# Patient Record
Sex: Male | Born: 1985 | Race: White | Hispanic: No | Marital: Single | Smoking: Never smoker
Health system: Southern US, Community
[De-identification: ages and names within clinical notes are randomized; demographics above are authoritative.]

## PROBLEM LIST (undated history)

## (undated) DIAGNOSIS — Z789 Other specified health status: Secondary | ICD-10-CM

## (undated) HISTORY — PX: TONSILLECTOMY: SUR1361

## (undated) HISTORY — PX: CYSTECTOMY: SUR359

---
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
## 2011-07-12 ENCOUNTER — Emergency Department (HOSPITAL_COMMUNITY)
Admission: EM | Admit: 2011-07-12 | Discharge: 2011-07-12 | Disposition: A | Discharge status: Home / Self Care | Payer: Self-pay | Attending: Emergency Medicine | Admitting: Emergency Medicine

## 2011-07-12 ENCOUNTER — Emergency Department (HOSPITAL_COMMUNITY): Payer: Self-pay

## 2011-07-12 DIAGNOSIS — N508 Other specified disorders of male genital organs: Secondary | ICD-10-CM | POA: Insufficient documentation

## 2011-07-12 DIAGNOSIS — Z049 Encounter for examination and observation for unspecified reason: Secondary | ICD-10-CM | POA: Insufficient documentation

## 2012-08-23 ENCOUNTER — Emergency Department (HOSPITAL_COMMUNITY)
Admission: EM | Admit: 2012-08-23 | Discharge: 2012-08-23 | Disposition: A | Discharge status: Home / Self Care | Payer: Self-pay | Attending: Emergency Medicine | Admitting: Emergency Medicine

## 2012-08-23 ENCOUNTER — Encounter (HOSPITAL_COMMUNITY): Payer: Self-pay | Admitting: *Deleted

## 2012-08-23 DIAGNOSIS — Z880 Allergy status to penicillin: Secondary | ICD-10-CM | POA: Insufficient documentation

## 2012-08-23 DIAGNOSIS — IMO0002 Reserved for concepts with insufficient information to code with codable children: Secondary | ICD-10-CM | POA: Insufficient documentation

## 2012-08-23 DIAGNOSIS — L02412 Cutaneous abscess of left axilla: Secondary | ICD-10-CM

## 2012-08-23 MED ORDER — OXYCODONE-ACETAMINOPHEN 5-325 MG PO TABS: 2.0000 | ORAL_TABLET | ORAL | Status: AC | PRN

## 2012-08-23 MED ORDER — CLINDAMYCIN HCL 150 MG PO CAPS: 300.0000 mg | ORAL_CAPSULE | Freq: Three times a day (TID) | ORAL | Status: AC

## 2012-08-23 MED ORDER — OXYCODONE-ACETAMINOPHEN 5-325 MG PO TABS
1.0000 | ORAL_TABLET | Freq: Once | ORAL | Status: AC
  Administered 2012-08-23: 1 via ORAL
  Filled 2012-08-23: qty 1

## 2012-08-23 NOTE — ED Provider Notes (Signed)
History     CSN: [id number]  Arrival date & time 08/23/12  0049   First MD Initiated Contact with Patient 08/23/12 [phone]      Chief Complaint  Patient presents with  . Recurrent Skin Infections    (Consider location/radiation/quality/duration/timing/severity/associated sxs/prior treatment) HPI History provided by pt.   Pt developed an abscess of right axilla 2 days ago.  Has pain with ROM of RUE.  No associated fever and is otherwise feeling well.  Has used warm compresses w/out relief.    History reviewed. No pertinent past medical history.  History reviewed. No pertinent past surgical history.  No family history on file.  History  Substance Use Topics  . Smoking status: Not on file  . Smokeless tobacco: Not on file  . Alcohol Use: Not on file      Review of Systems  All other systems reviewed and are negative.    Allergies  Penicillins  Home Medications  No current outpatient prescriptions on file.  BP 123/76  Pulse 78  Temp 98.5 F (36.9 C) (Oral)  Resp 18  SpO2 100%  Physical Exam  Nursing note and vitals reviewed. Constitutional: He is oriented to person, place, and time. He appears well-developed and well-nourished. No distress.  HENT:  Head: Normocephalic and atraumatic.  Eyes:       Normal appearance  Neck: Normal range of motion.  Pulmonary/Chest: Effort normal.  Musculoskeletal: Normal range of motion.  Neurological: He is alert and oriented to person, place, and time.  Skin:       4x3cm abscess left axilla w/ erythema spreading into upper arm.  Non-draining.  Ttp.    Psychiatric: He has a normal mood and affect. His behavior is normal.    ED Course  Procedures (including critical care time) INCISION AND DRAINAGE Performed by: Otilio Miu Consent: Verbal consent obtained. Risks and benefits: risks, benefits and alternatives were discussed Type: abscess  Body area: L axilla     Anesthesia: local infiltration  Local  anesthetic: lidocaine 2% w/ epinephrine  Anesthetic total: 10 ml  Complexity: complex Blunt dissection to break up loculations  Drainage: purulent  Drainage amount: large  Packing material: 1/4 in iodoform gauze  Patient tolerance: Patient tolerated the procedure well with no immediate complications.    Labs Reviewed - No data to display No results found.   1. Abscess of axilla, left       MDM  Healthy 26yo M presents w/ abscess of L axilla w/ surrounding cellulitis.  I&D'd, margins of cellulitis drawn and d/c'd home w/ clinda (pen allergic) and percocet.  Will return in 2d for recheck.        Otilio Miu, [place] 08/23/12 [phone]

## 2012-08-23 NOTE — ED Notes (Signed)
Pt c/o boil under left arm x 1 wk; draining

## 2012-08-23 NOTE — ED Provider Notes (Signed)
Medical screening examination/treatment/procedure(s) were performed by non-physician practitioner and as supervising physician I was immediately available for consultation/collaboration.  De Libman K Odetta Forness-Rasch, MD 08/23/12 0745 

## 2012-08-29 IMAGING — US US SCROTUM
1 series · 14 of 25 positions shown · non-contrast
Comparison: None

CLINICAL DATA: Left scrotal mass.

SCROTAL ULTRASOUND
DOPPLER ULTRASOUND OF THE TESTICLES
TECHNIQUE: Complete ultrasound examination of the testicles,
epididymis, and other scrotal structures was performed.  Color and
spectral Doppler ultrasound were also utilized to evaluate blood
flow to the testicles.

[Series 1: us scrotum · 0.08mm/px · 14 of 39 slices shown]
[im 1/39]
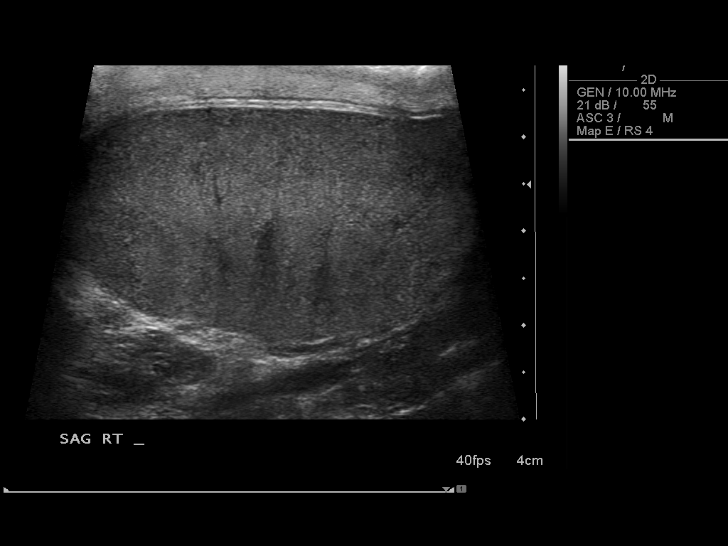
[im 4/39]
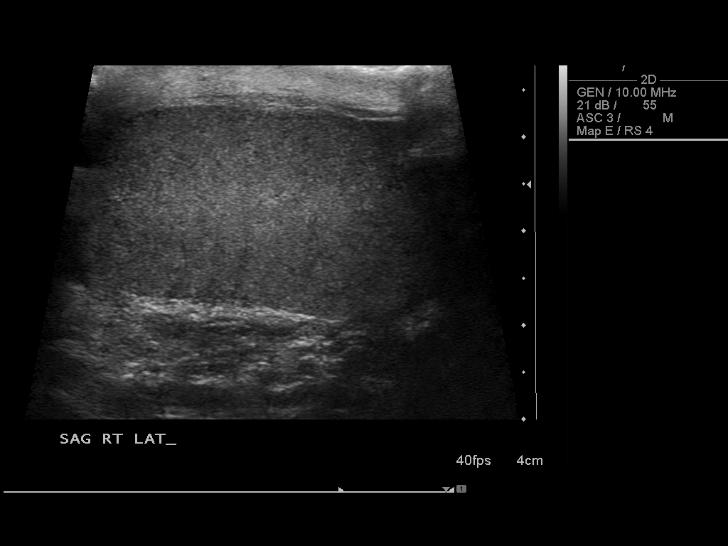
[im 7/39]
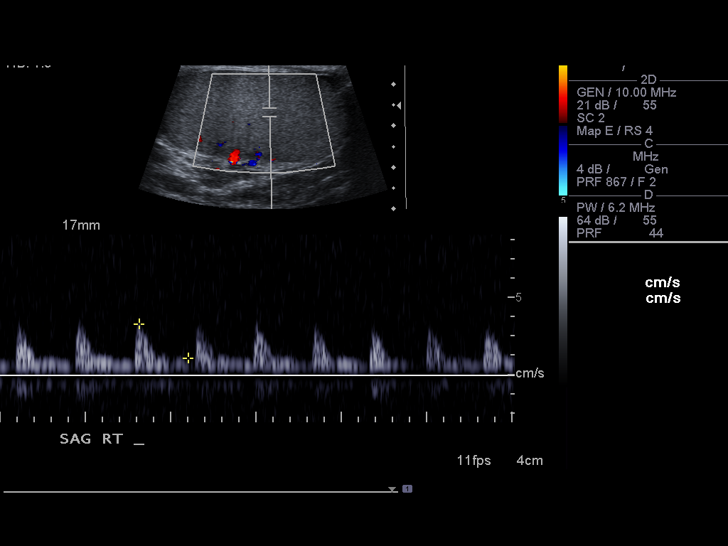
[im 10/39]
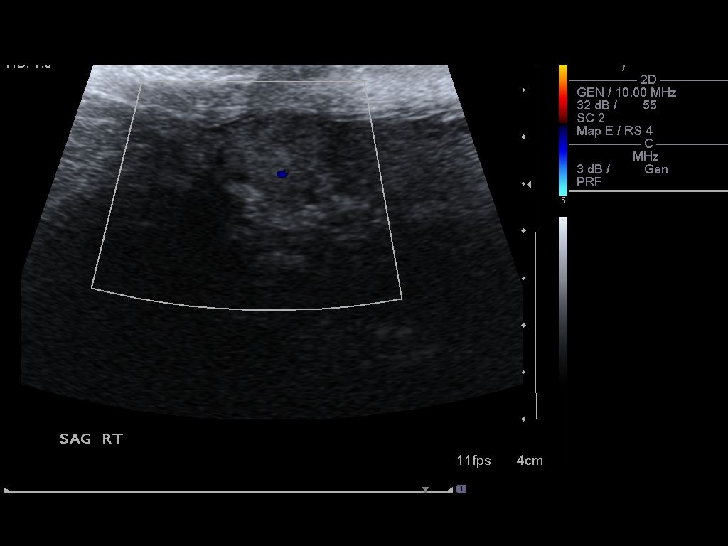
[im 13/39]
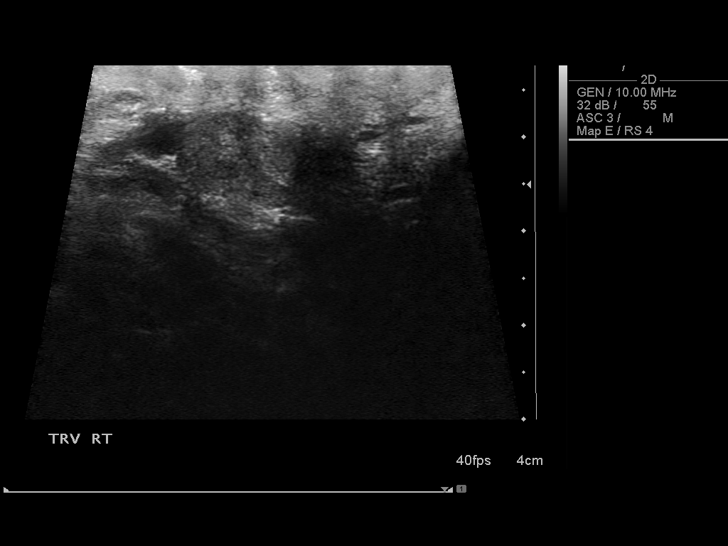
[im 15/39]
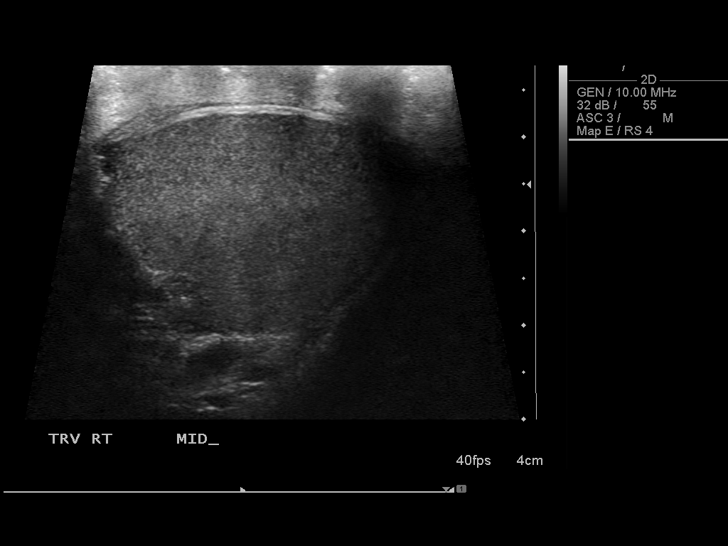
[im 18/39]
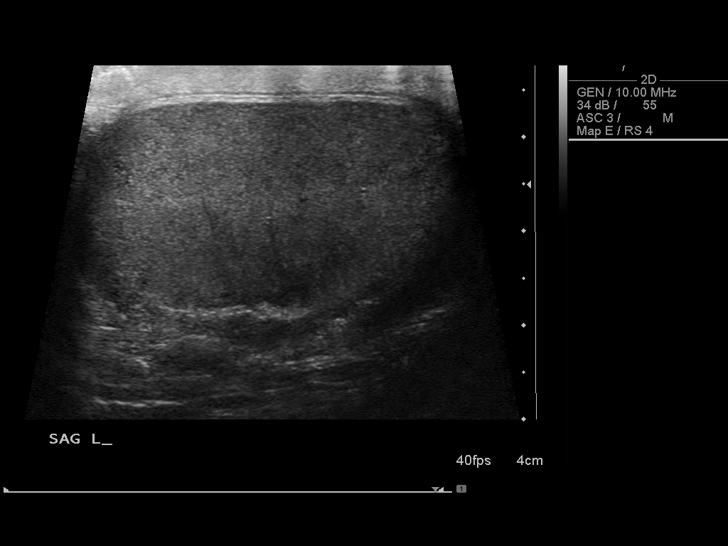
[im 21/39]
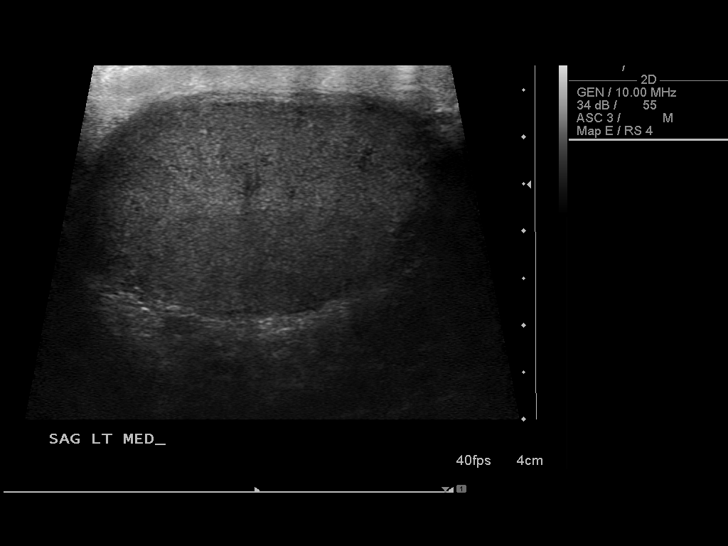
[im 24/39]
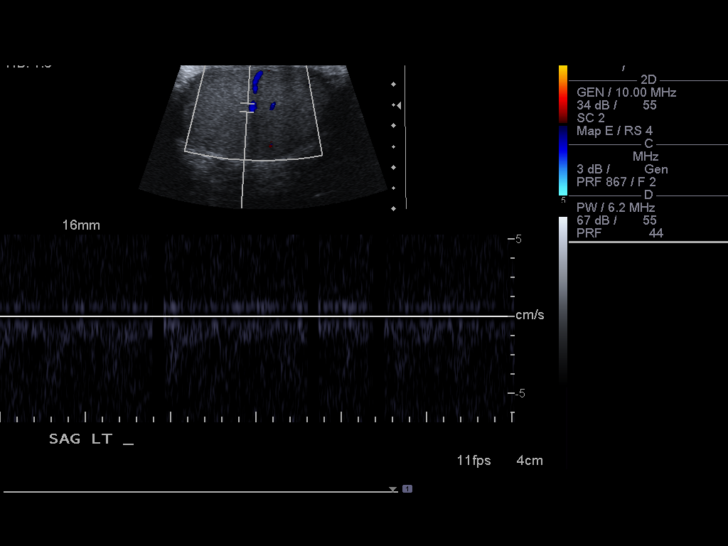
[im 26/39]
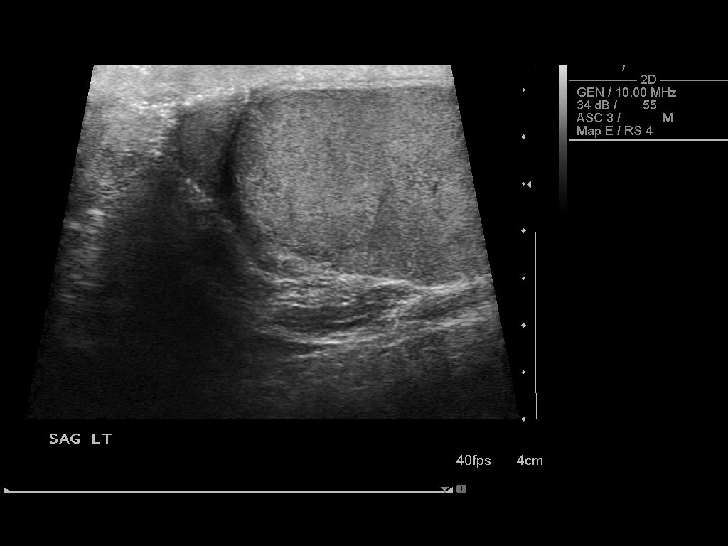
[im 29/39]
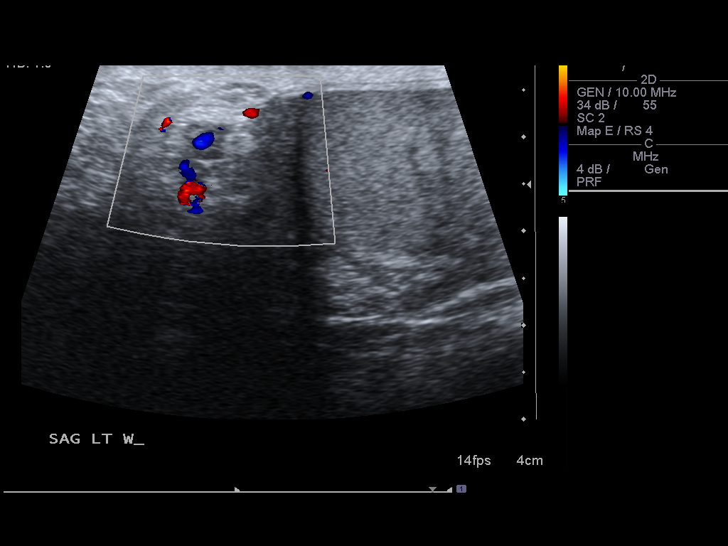
[im 32/39]
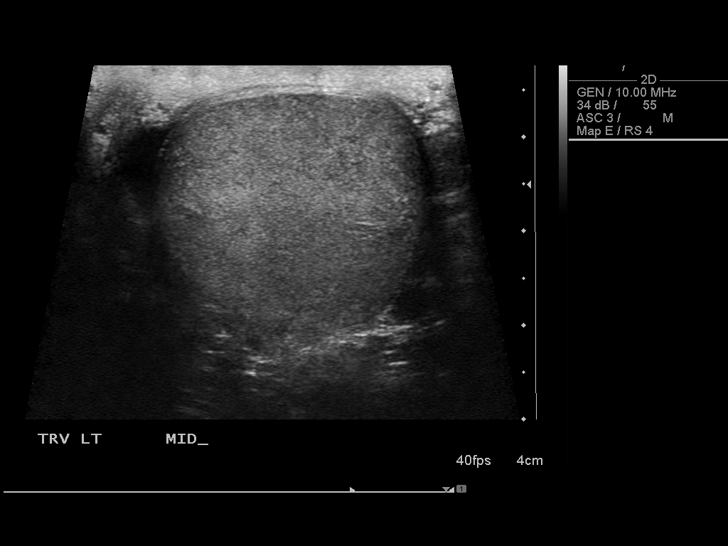
[im 35/39]
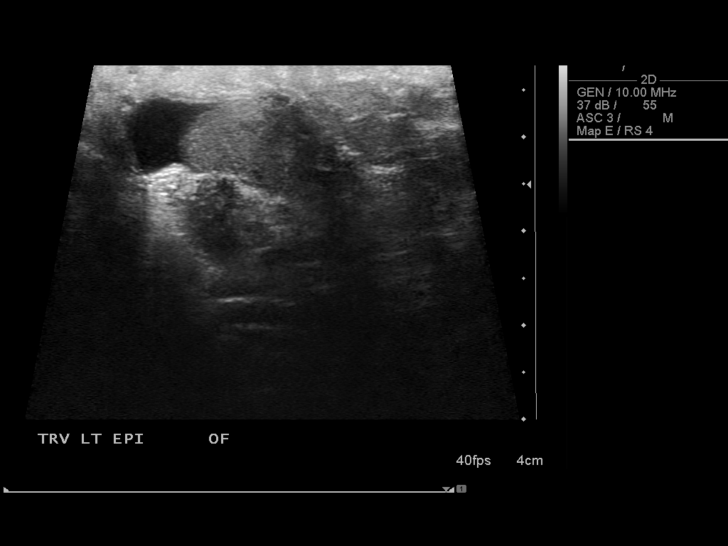
[im 39/39]
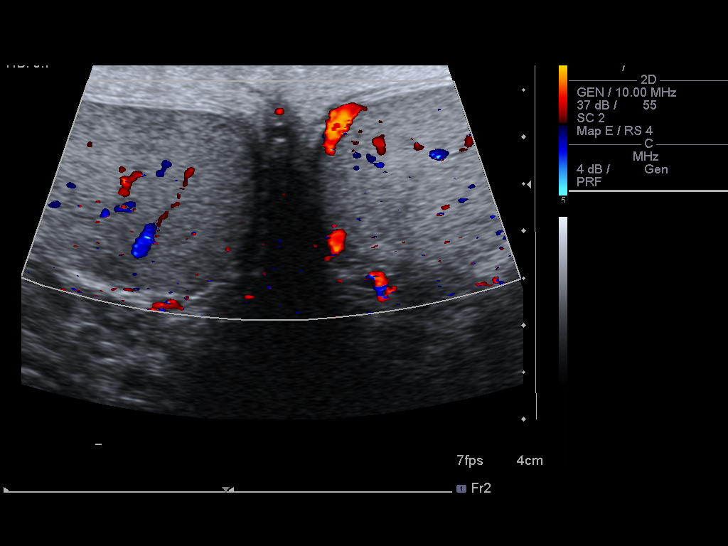

[14 of 25 positions shown; findings below may reference images not displayed]

FINDINGS: Right testis:  Measures 4.3 x 2.6 x 2.5 cm.  Normal homogeneous
echogenicity without focal lesions.  Patent intratesticular blood
flow.

Left testis:  Measures 3.9 x 2.3 x 2.8 cm.  Normal homogeneous
echogenicity without focal lesions.  Patent intra testicular blood
flow.

Right epididymis:  Normal in size and appearance. No increased
blood flow to suggest epididymitis.

Left epididymis:  Normal in size and appearance. No increased blood
flow to suggest epididymitis.

Hydocele:  None

Varicocele:  None

Pulsed Doppler interrogation of both testes demonstrates low
resistance flow bilaterally.
IMPRESSION: Normal scrotal ultrasound.  No mass is identified.  Recommend
clinical follow-up.

## 2012-10-09 ENCOUNTER — Emergency Department (HOSPITAL_COMMUNITY)
Admission: EM | Admit: 2012-10-09 | Discharge: 2012-10-09 | Disposition: A | Discharge status: Home / Self Care | Payer: Self-pay | Attending: Emergency Medicine | Admitting: Emergency Medicine

## 2012-10-09 ENCOUNTER — Encounter (HOSPITAL_COMMUNITY): Payer: Self-pay | Admitting: Emergency Medicine

## 2012-10-09 DIAGNOSIS — L03019 Cellulitis of unspecified finger: Secondary | ICD-10-CM | POA: Insufficient documentation

## 2012-10-09 DIAGNOSIS — IMO0001 Reserved for inherently not codable concepts without codable children: Secondary | ICD-10-CM

## 2012-10-09 MED ORDER — MORPHINE SULFATE 4 MG/ML IJ SOLN
2.0000 mg | Freq: Once | INTRAMUSCULAR | Status: AC
  Administered 2012-10-09: 2 mg via INTRAVENOUS
  Filled 2012-10-09: qty 1

## 2012-10-09 MED ORDER — BACITRACIN ZINC 500 UNIT/GM EX OINT
TOPICAL_OINTMENT | CUTANEOUS | Status: AC
  Administered 2012-10-09: 20:00:00
  Filled 2012-10-09: qty 0.9

## 2012-10-09 MED ORDER — CLINDAMYCIN PHOSPHATE 900 MG/50ML IV SOLN
900.0000 mg | Freq: Once | INTRAVENOUS | Status: AC
  Administered 2012-10-09: 900 mg via INTRAVENOUS
  Filled 2012-10-09: qty 50

## 2012-10-09 MED ORDER — HYDROCODONE-ACETAMINOPHEN 5-325 MG PO TABS: 2.0000 | ORAL_TABLET | ORAL | Status: DC | PRN

## 2012-10-09 MED ORDER — SULFAMETHOXAZOLE-TRIMETHOPRIM 800-160 MG PO TABS: 1.0000 | ORAL_TABLET | Freq: Two times a day (BID) | ORAL | Status: DC

## 2012-10-09 NOTE — ED Provider Notes (Signed)
History  This chart was scribed for non-physician practitioner working with Lawrence Canal, MD by Shari Heritage. This patient was seen in room WTR5/WTR5 and the patient's care was started at 1733.   CSN: [id number]  Arrival date & time 10/09/12  1328     Chief Complaint  Patient presents with  . Hand Pain    Patient is a 26 y.o. male presenting with hand pain. The history is provided by the patient. No language interpreter was used.  Hand Pain This is a new problem. The current episode started 2 days ago. The problem occurs constantly. The problem has not changed since onset.Nothing aggravates the symptoms. Nothing relieves the symptoms. He has tried nothing for the symptoms.    HPI Comments: Lawrence Hampton is a 26 y.o. male who presents to the Emergency Department complaining of constant, throbbing, non-radiating left 3rd finger pain with associated swelling onset 2 days ago. Patient denies fever and numbness or tingling in the finger. He states that he has soaked his hand in a salt solution with no relief. Patient is a nail biter. He denies any obvious injury or trauma to the hand. Patient is allergic to penicillin. He reports no significant past medical history. He has never smoked.   History reviewed. No pertinent past medical history.  Past Surgical History  Procedure Date  . Tonsillectomy     History reviewed. No pertinent family history.  History  Substance Use Topics  . Smoking status: Never Smoker   . Smokeless tobacco: Not on file  . Alcohol Use: No      Review of Systems  Constitutional: Negative for fever.  Musculoskeletal: Positive for myalgias.  Neurological: Negative for weakness and numbness.  All other systems reviewed and are negative.    Allergies  Penicillins  Home Medications   Current Outpatient Rx  Name Route Sig Dispense Refill  . GOODY HEADACHE PO Oral Take 1 Package by mouth as needed. Headache    . DIPHENHYDRAMINE-APAP (SLEEP) 25-500 MG  PO TABS Oral Take 1 tablet by mouth at bedtime as needed. Pain      BP 143/84  Pulse 75  Temp 98.5 F (36.9 C) (Oral)  Resp 16  SpO2 97%  Physical Exam  Nursing note and vitals reviewed. Constitutional: He is oriented to person, place, and time. He appears well-developed and well-nourished. No distress.  HENT:  Head: Normocephalic and atraumatic.  Eyes: Conjunctivae normal are normal.  Neck: Normal range of motion. Neck supple.  Cardiovascular: Normal rate, regular rhythm and intact distal pulses.  Exam reveals no gallop and no friction rub.   No murmur heard. Pulmonary/Chest: Effort normal and breath sounds normal. No respiratory distress. He has no wheezes. He has no rales. He exhibits no tenderness.  Abdominal: Soft. He exhibits no distension.  Musculoskeletal: Normal range of motion.  Neurological: He is alert and oriented to person, place, and time. Coordination normal.       Speech is goal-oriented. Moves limbs without ataxia.   Skin: Skin is warm and dry. He is not diaphoretic.       Left hand 3rd finger nailbed erythematous, edematous, with underlying pus. Left 3rd finger tender to palpation. Red streaking from left 3rd finger down hand, toward wrist.   Psychiatric: He has a normal mood and affect. His behavior is normal.    ED Course  Procedures (including critical care time) DIAGNOSTIC STUDIES: Oxygen Saturation is 97% on room air, adequate by my interpretation.    COORDINATION OF  CARE: 5:33pm- Patient informed of current plan for treatment and evaluation and agrees with plan at this time.   7:42pm- Performed I&D to left 3rd finger.  INCISION AND DRAINAGE PROCEDURE NOTE: Patient identification was confirmed and verbal consent was obtained. This procedure was performed by Emilia Beck, PA at 7:42 PM. Site: left 3rd finger nailbed Sterile procedures observed: yes Blade size: 11 Drainage: 3 ml Complexity: simple Incision made over site, wound drained, rinsed  with copious amounts of normal saline, covered with dry, sterile dressing. Pt tolerated procedure well without complications.  Instructions for care discussed verbally and pt provided with additional written instructions for homecare and f/u.      Labs Reviewed - No data to display No results found.   1. Paronychia of third finger, left       MDM  Patient received IV Clindamycin 900mg . Paronychia drained without complication. Patient given Bactrim and Percocet for prescriptions. Patient should return with worsening or concerning symptoms.   This chart was written by a scribe in my presence. I have reviewed and agree with the note.    Emilia Beck, [place] 10/09/12 2339

## 2012-10-09 NOTE — Progress Notes (Signed)
Self pay [facility] resident with no pcp nor coverage as confirmed by pt CM spoke with pt to review list of self pay pcps to further assist him with prescriptions and health care.  Discussed discounted pharmacies, DSS, health dept, needymeds.org and financial assistance programs in [employer]  Pt voiced understanding and appreciation of resources and services offered Pt inquired about how to get medicaid Reports had medicaid until he turned 20.  CM discussed DSS services Cm also provided health reform information

## 2012-10-09 NOTE — ED Notes (Signed)
Pt states that he woke up 2 days ago with swelling to left middle finger.  Thinks it may be infected.  Pain 8/10.

## 2012-10-10 NOTE — ED Provider Notes (Signed)
Medical screening examination/treatment/procedure(s) were conducted as a shared visit with non-physician practitioner(s) and myself.  I personally evaluated the patient during the encounter  Lawrence Hampton is a 26 y.o. male here with L 3rd finger swelling. He bites his nails usually and now he has a paronychia of L 3rd digit. There is surrounding cellulitis tracking to the dorsum side of the hand. There is no felon appreciated. He was I&D by PA. I recommended IV clinda and then d/c on clinda PO.     Richardean Canal, MD 10/10/12 [phone]

## 2013-01-19 ENCOUNTER — Emergency Department (HOSPITAL_COMMUNITY)
Admission: EM | Admit: 2013-01-19 | Discharge: 2013-01-19 | Disposition: A | Discharge status: Home / Self Care | Payer: Self-pay | Attending: Emergency Medicine | Admitting: Emergency Medicine

## 2013-01-19 ENCOUNTER — Encounter (HOSPITAL_COMMUNITY): Payer: Self-pay

## 2013-01-19 DIAGNOSIS — B369 Superficial mycosis, unspecified: Secondary | ICD-10-CM | POA: Insufficient documentation

## 2013-01-19 MED ORDER — NYSTATIN-TRIAMCINOLONE 100000-0.1 UNIT/GM-% EX OINT: TOPICAL_OINTMENT | Freq: Two times a day (BID) | CUTANEOUS | Status: DC

## 2013-01-19 NOTE — ED Provider Notes (Signed)
History   This chart was scribed for [employer]. Rubin Payor, MD by Leone Payor, ED Scribe. This patient was seen in room WTR5/WTR5 and the patient's care was started 3:27 PM.   CSN: [id number]  Arrival date & time 01/19/13  1320   First MD Initiated Contact with Patient 01/19/13 1506      Chief Complaint  Patient presents with  . Rash     The history is provided by the patient. No language interpreter was used.    Lawrence Hampton is a 27 y.o. male who presents to the Emergency Department complaining of ongoing, gradually worsening, constant rash that started in mid October 2013 to the under abdominal folds and bilateral groin areas. Pt states he has used baby powder and diaper rash cream to treat but with minimal relief. Pt states he has associated itchiness, soreness, redness. He denies fever, chills. Pt denies no new clothes, soaps, detergents.   Pt has h/o cystectomy, tonsillectomy.  Pt denies smoking and alcohol use.  History reviewed. No pertinent past medical history.  Past Surgical History  Procedure Date  . Tonsillectomy   . Cystectomy     No family history on file.  History  Substance Use Topics  . Smoking status: Never Smoker   . Smokeless tobacco: Never Used  . Alcohol Use: No      Review of Systems  A complete 10 system review of systems was obtained and all systems are negative except as noted in the HPI and PMH.    Allergies  Penicillins  Home Medications   Current Outpatient Rx  Name  Route  Sig  Dispense  Refill  . GOODY HEADACHE PO   Oral   Take 1 Package by mouth as needed. Headache           BP 120/62  Pulse 73  Temp 97.7 F (36.5 C) (Oral)  Resp 16  SpO2 96%  Physical Exam  Nursing note and vitals reviewed. Constitutional: He is oriented to person, place, and time. He appears well-developed and well-nourished. No distress.  HENT:  Head: Normocephalic and atraumatic.  Eyes: EOM are normal.  Neck: Neck supple. No tracheal  deviation present.  Cardiovascular: Normal rate, regular rhythm and normal heart sounds.   Pulmonary/Chest: Effort normal and breath sounds normal. No respiratory distress.  Abdominal: Soft. Bowel sounds are normal.  Musculoskeletal: Normal range of motion.  Neurological: He is alert and oriented to person, place, and time.  Skin: Skin is warm, dry and intact. Rash noted.       Moist erythematous rash under pannus of stomach in the skin folds. No warmth, no evidence of secondary infection.   Psychiatric: He has a normal mood and affect. His behavior is normal.    ED Course  Procedures (including critical care time)  DIAGNOSTIC STUDIES: Oxygen Saturation is 96% on room air, adequate by my interpretation.    COORDINATION OF CARE:  3:26 PM Discussed discharge plan which includes topical cream with pt and pt agreed to plan. Also advised pt to follow up and pt agreed.    Labs Reviewed - No data to display No results found.   1. Fungal dermatitis       MDM  27 year old male with fungal dermatitis. No evidence of secondary infection. Nystatin cream given. Advised him to keep the area dry. Return precautions discussed. Patient states his understanding of plan and is agreeable.     I personally performed the services described in this documentation, which was  scribed in my presence. The recorded information has been reviewed and is accurate.    Trevor Mace, PA-C 01/19/13 1658

## 2013-01-19 NOTE — ED Provider Notes (Signed)
Medical screening examination/treatment/procedure(s) were performed by non-physician practitioner and as supervising physician I was immediately available for consultation/collaboration.  Juliet Rude. Rubin Payor, MD 01/19/13 2355

## 2013-01-19 NOTE — ED Notes (Signed)
Patient reports redness under abdominal folds and bilataral groin areas. patient reports using otc meds i.e  Gold bond, diaper rash creams.

## 2013-02-21 ENCOUNTER — Encounter (HOSPITAL_COMMUNITY): Payer: Self-pay | Admitting: *Deleted

## 2013-02-21 ENCOUNTER — Emergency Department (HOSPITAL_COMMUNITY)
Admission: EM | Admit: 2013-02-21 | Discharge: 2013-02-21 | Disposition: A | Discharge status: Home / Self Care | Payer: Self-pay | Attending: Emergency Medicine | Admitting: Emergency Medicine

## 2013-02-21 DIAGNOSIS — R21 Rash and other nonspecific skin eruption: Secondary | ICD-10-CM | POA: Insufficient documentation

## 2013-02-21 DIAGNOSIS — L02419 Cutaneous abscess of limb, unspecified: Secondary | ICD-10-CM | POA: Insufficient documentation

## 2013-02-21 DIAGNOSIS — A4902 Methicillin resistant Staphylococcus aureus infection, unspecified site: Secondary | ICD-10-CM | POA: Insufficient documentation

## 2013-02-21 DIAGNOSIS — B356 Tinea cruris: Secondary | ICD-10-CM | POA: Insufficient documentation

## 2013-02-21 MED ORDER — NYSTATIN-TRIAMCINOLONE 100000-0.1 UNIT/GM-% EX CREA: TOPICAL_CREAM | CUTANEOUS | Status: DC

## 2013-02-21 MED ORDER — SULFAMETHOXAZOLE-TRIMETHOPRIM 800-160 MG PO TABS: 1.0000 | ORAL_TABLET | Freq: Two times a day (BID) | ORAL | Status: DC

## 2013-02-21 NOTE — ED Provider Notes (Signed)
History    This chart was scribed for non-physician practitioner working with Hilario Quarry, MD by Toya Smothers, ED Scribe. This patient was seen in room WTR5/WTR5 and the patient's care was started at 7:07 PM.  CSN: [id number]  Arrival date & time 02/21/13  1713   First MD Initiated Contact with Patient 02/21/13 1903      Chief Complaint  Patient presents with  . Abscess   The history is provided by the patient. No language interpreter was used.    Lawrence Hampton is a 27 y.o. 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 Pain is 8/10, worse with palpation, and alleviated by nothing. Pt states that he attempted to lyse the abscess and apply OTC itching cream, though there has been no improvement to pain. No trauma, fever, chills, cough, congestion, rhinorrhea, chest pain, SOB, or n/v/d. Pt denies use of tobacco, alcohol, and illicit drug use.  Pt also complaining of recurrent fungal rash in groin area and folds of the stomach pannus. He was here on 01/19/13 for treatment of same. Rash is better than it was, but not resolved and would like a refill of the cream he was given.    History reviewed. No pertinent past medical history.  Past Surgical History  Procedure Laterality Date  . Tonsillectomy    . Cystectomy      History reviewed. No pertinent family history.  History  Substance Use Topics  . Smoking status: Never Smoker   . Smokeless tobacco: Never Used  . Alcohol Use: No    Review of Systems  Constitutional: Negative for diaphoresis.  HENT: Negative for neck pain and neck stiffness.   Eyes: Negative for visual disturbance.  Respiratory: Negative for apnea, chest tightness and shortness of breath.   Cardiovascular: Negative for chest pain and palpitations.  Gastrointestinal: Negative for diarrhea and constipation.   Genitourinary: Negative for dysuria.  Musculoskeletal: Negative for gait problem.  Skin: Positive for rash.       Raised red pruritic plaques of groin, inner thigh, and pannus of stomach. Red, inflammed, tender area to left lateral calf. Not warm to touch. No pus.  Neurological: Negative for dizziness, weakness, light-headedness and numbness.    Allergies  Penicillins  Home Medications  No current outpatient prescriptions on file.  BP 142/91  Pulse 87  Temp(Src) 97.7 F (36.5 C) (Oral)  Resp 18  Wt 240 lb (108.863 kg)  SpO2 98%  Physical Exam  Nursing note and vitals reviewed. Constitutional: He is oriented to person, place, and time. He appears well-developed and well-nourished. No distress.  HENT:  Head: Normocephalic and atraumatic.  Eyes: EOM are normal. Pupils are equal, round, and reactive to light.  Neck: Normal range of motion. Neck supple.  No meningeal signs  Cardiovascular: Normal rate, regular rhythm and normal heart sounds.  Exam reveals no gallop and no friction rub.   No murmur heard. Pulmonary/Chest: Effort normal and breath sounds normal. No respiratory distress. He has no wheezes. He has no rales. He exhibits no tenderness.  Abdominal: Soft. Bowel sounds are normal. He exhibits no distension. There is no tenderness. There is no rebound and no guarding.  Musculoskeletal: Normal range of motion. He exhibits no edema and no tenderness.  Neurological: He is alert and oriented to person, place, and time. No cranial nerve deficit.  Skin: Skin is warm  and dry. He is not diaphoretic. No erythema.  4 cm x 4 cm area of erythema, mild swelling, and tenderness to the peroneus aspect of the right calf. Warm to touch. Non purulent. Resolving tinea cruris on inner thighs and pannus of stomach.     ED Course  Procedures DIAGNOSTIC STUDIES: Oxygen Saturation is 98% on room air, normal by my interpretation.    COORDINATION OF CARE: 19:04- Evaluated Pt. Pt is awake,  alert, and without distress. 19:07- Patient understand and agree with initial ED impression and plan with expectations set for ED visit.   Labs Reviewed - No data to display No results found.   Diagnosis: nonpurulent cellulitis    MDM  Pt is a frequent visitor to the ED for a variety of skin conditions. Had a lengthy disucssion about seeing a dermatologist and establishing a relationship with a primary care doctor to help him manage these chronic conditions.   Redness, swelling, mildly tender, warm to touch. Skin intact, No bleeding. No bullae. Non purulent. Non circumferential.  Borders are not elevated or sharply demarcated.  Preformed ultrasound and did not detect any occult abscess. Drew a line around the area of infection. Pt was instructed to return to the ED if area surpasses the boarder.   Will prescribed Bactrim to cover for MRSA, direct pt to apply warm compresses and to return to ED for I&D if pain should increase or abscess should develop. Will provide dermatologist contact information and resource. Will provide refill of nystatin-triamcinolone with direction to keep area dry. Rash is nearly resolved.  At this time there does not appear to be any evidence of an acute emergency medical condition and the patient appears stable for discharge with appropriate outpatient follow up.Diagnosis was discussed with patient who verbalizes understanding and is agreeable to discharge.   I personally performed the services described in this documentation, which was scribed in my presence. The recorded information has been reviewed and is accurate.    Glade Nurse, PA-C 02/22/13 2021

## 2013-02-21 NOTE — ED Notes (Signed)
Pt from home with reports of a swollen, red, painful area to right calf that was first noticed a week ago. Pt endorses hx of same but in different areas with I & D twice. Pt also reports that he was treated here for a rash last month and was given RX which helped but rash not completely gone and is requesting a refill.

## 2013-02-22 NOTE — ED Provider Notes (Signed)
History/physical exam/procedure(s) were performed by non-physician practitioner and as supervising physician I was immediately available for consultation/collaboration. I have reviewed all notes and am in agreement with care and plan.   Danielle S Ray, MD 02/22/13 2316 

## 2014-03-19 ENCOUNTER — Emergency Department (HOSPITAL_COMMUNITY)
Admission: EM | Admit: 2014-03-19 | Discharge: 2014-03-19 | Disposition: A | Discharge status: Home / Self Care | Payer: Self-pay | Attending: Emergency Medicine | Admitting: Emergency Medicine

## 2014-03-19 ENCOUNTER — Emergency Department (HOSPITAL_COMMUNITY): Payer: Self-pay

## 2014-03-19 ENCOUNTER — Encounter (HOSPITAL_COMMUNITY): Payer: Self-pay | Admitting: Emergency Medicine

## 2014-03-19 DIAGNOSIS — R0789 Other chest pain: Secondary | ICD-10-CM | POA: Insufficient documentation

## 2014-03-19 DIAGNOSIS — R209 Unspecified disturbances of skin sensation: Secondary | ICD-10-CM | POA: Insufficient documentation

## 2014-03-19 DIAGNOSIS — R079 Chest pain, unspecified: Secondary | ICD-10-CM

## 2014-03-19 DIAGNOSIS — M79609 Pain in unspecified limb: Secondary | ICD-10-CM | POA: Insufficient documentation

## 2014-03-19 DIAGNOSIS — Z880 Allergy status to penicillin: Secondary | ICD-10-CM | POA: Insufficient documentation

## 2014-03-19 DIAGNOSIS — M79602 Pain in left arm: Secondary | ICD-10-CM

## 2014-03-19 LAB — COMPREHENSIVE METABOLIC PANEL
ALK PHOS: 68 U/L (ref 39–117)
ALT: 16 U/L (ref 0–53)
AST: 17 U/L (ref 0–37)
Albumin: 4.1 g/dL (ref 3.5–5.2)
BILIRUBIN TOTAL: 0.9 mg/dL (ref 0.3–1.2)
BUN: 11 mg/dL (ref 6–23)
CHLORIDE: 100 meq/L (ref 96–112)
CO2: 27 meq/L (ref 19–32)
CREATININE: 0.79 mg/dL (ref 0.50–1.35)
Calcium: 9.6 mg/dL (ref 8.4–10.5)
GFR calc Af Amer: >90 mL/min (ref >90)
Glucose, Bld: 85 mg/dL (ref 70–99)
POTASSIUM: 4.4 meq/L (ref 3.7–5.3)
Sodium: 139 mEq/L (ref 137–147)
Total Protein: 7.6 g/dL (ref 6.0–8.3)

## 2014-03-19 LAB — CBC
HEMATOCRIT: 46.8 % (ref 39.0–52.0)
Hemoglobin: 16 g/dL (ref 13.0–17.0)
MCH: 29.3 pg (ref 26.0–34.0)
MCHC: 34.2 g/dL (ref 30.0–36.0)
MCV: 85.6 fL (ref 78.0–100.0)
PLATELETS: 249 10*3/uL (ref 150–400)
RBC: 5.47 MIL/uL (ref 4.22–5.81)
RDW: 12.8 % (ref 11.5–15.5)
WBC: 8.3 10*3/uL (ref 4.0–10.5)

## 2014-03-19 LAB — TROPONIN I: Troponin I: <0.3 ng/mL (ref <0.30)

## 2014-03-19 NOTE — ED Provider Notes (Signed)
Medical screening examination/treatment/procedure(s) were performed by non-physician practitioner and as supervising physician I was immediately available for consultation/collaboration.   EKG Interpretation   Date/Time:  Thursday March 19 2014 17:20:50 EDT Ventricular Rate:  83 PR Interval:  151 QRS Duration: 95 QT Interval:  358 QTC Calculation: 421 R Axis:   46 Text Interpretation:  Sinus rhythm Baseline wander in lead(s) V1 V2 V5 No  previous ECGs available Confirmed by [facility]  [facility], Jonny RuizJOHN ([id number]) on 03/19/2014  5:35:44 PM     I personally reviewed the EKG myself and agree with the interpretation   Celene KrasJon R Novalie Leamy, MD 03/19/14 2344

## 2014-03-19 NOTE — ED Notes (Signed)
Pt c/o left arm pain that started last night. Pt states last night he felt a pressure on his left side of chest but it went away, had slight headache. Denies n/v, headache

## 2014-03-19 NOTE — ED Provider Notes (Signed)
CSN: [id number]     Arrival date & time 03/19/14  1702 History   First MD Initiated Contact with Patient 03/19/14 1716     Chief Complaint  Patient presents with  . Arm Pain    left     (Consider location/radiation/quality/duration/timing/severity/associated sxs/prior Treatment) Patient is a 28 y.o. male presenting with arm pain. The history is provided by the patient and medical records. No language interpreter was used.  Arm Pain Associated symptoms include chest pain. Pertinent negatives include no abdominal pain, coughing, diaphoresis, fatigue, fever, headaches, nausea, rash or vomiting.    Lawrence Hampton is a 28 y.o. male  with a hx of cystectomy presents to the Emergency Department complaining of gradual, persistent, progressively worsening left chest pain with painful paresthesias of the left arm onset last night which resolved after several minutes and then the painful paresthesias returned this afternoon resolving prior to his arrival in the ED.  Pt describes his pain as sharp and crampy, but denies a tearing sensation or associated SOB, nausea, weakness, dizziness.   He expresses concern for an MI, but denies all risk factors.  No exogenous estrogen, no recent surgery, no recent travel or immobilization. Pt does report he thinks it could be a panic attack, but can give me no reason for this.  Nothing made his symptoms better or worse when he had them and they resolved spontaneously both times.  Pt also denies fever, chills, headache, neck pain, cough, SOB, abd pain, N/V/D, weakness, dizziness, syncope, dysuria, hematuria.      History reviewed. No pertinent past medical history. Past Surgical History  Procedure Laterality Date  . Tonsillectomy    . Cystectomy     No family history on file. History  Substance Use Topics  . Smoking status: Never Smoker   . Smokeless tobacco: Never Used  . Alcohol Use: No    Review of Systems  Constitutional: Negative for fever, diaphoresis,  appetite change, fatigue and unexpected weight change.  HENT: Negative for mouth sores.   Eyes: Negative for visual disturbance.  Respiratory: Positive for chest tightness. Negative for cough, shortness of breath and wheezing.   Cardiovascular: Positive for chest pain. Leg swelling: left sided.  Gastrointestinal: Negative for nausea, vomiting, abdominal pain, diarrhea and constipation.  Endocrine: Negative for polydipsia, polyphagia and polyuria.  Genitourinary: Negative for dysuria, urgency, frequency and hematuria.  Musculoskeletal: Negative for back pain and neck stiffness.  Skin: Negative for rash.  Allergic/Immunologic: Negative for immunocompromised state.  Neurological: Negative for syncope, light-headedness and headaches.       L arm paresthesias  Hematological: Does not bruise/bleed easily.  Psychiatric/Behavioral: Negative for sleep disturbance. The patient is not nervous/anxious.       Allergies  Penicillins  Home Medications   Current Outpatient Rx  Name  Route  Sig  Dispense  Refill  . diphenhydramine-acetaminophen (TYLENOL PM) 25-500 MG TABS   Oral   Take 1 tablet by mouth at bedtime as needed (sleep).          BP 110/64  Pulse 71  Temp(Src) 97.9 F (36.6 C) (Oral)  Resp 14  SpO2 97% Physical Exam  Nursing note and vitals reviewed. Constitutional: He is oriented to person, place, and time. He appears well-developed and well-nourished. No distress.  Awake, alert, nontoxic appearance  HENT:  Head: Normocephalic and atraumatic.  Mouth/Throat: Oropharynx is clear and moist. No oropharyngeal exudate.  Eyes: Conjunctivae are normal. No scleral icterus.  Neck: Normal range of motion. Neck supple.  Cardiovascular: Normal rate, regular rhythm, normal heart sounds and intact distal pulses.   No murmur heard. Pulses:      Radial pulses are 2+ on the right side, and 2+ on the left side.       Dorsalis pedis pulses are 2+ on the right side, and 2+ on the left  side.       Posterior tibial pulses are 2+ on the right side, and 2+ on the left side.  Capillary refill < 3 sec RRR  Pulmonary/Chest: Effort normal and breath sounds normal. No respiratory distress. He has no wheezes. He exhibits no tenderness.  Clear and equal breath sounds  Abdominal: Soft. Bowel sounds are normal. He exhibits no mass. There is no tenderness. There is no rebound and no guarding.  Musculoskeletal: Normal range of motion. He exhibits no edema.       Left shoulder: Normal. He exhibits normal range of motion, no tenderness, no bony tenderness, no swelling, no effusion, no crepitus, no deformity, no laceration, no pain, no spasm, normal pulse and normal strength.  Lymphadenopathy:    He has no cervical adenopathy.  Neurological: He is alert and oriented to person, place, and time. He has normal reflexes. No cranial nerve deficit. He exhibits normal muscle tone. Coordination normal.  Mental Status:  Alert, oriented, thought content appropriate. Speech fluent without evidence of aphasia. Able to follow 2 step commands without difficulty.  Cranial Nerves:  II:  Peripheral visual fields grossly normal, pupils equal, round, reactive to light III,IV, VI: ptosis not present, extra-ocular motions intact bilaterally  V,VII: smile symmetric, facial light touch sensation equal VIII: hearing grossly normal bilaterally  IX,X: gag reflex present  XI: bilateral shoulder shrug equal and strong XII: midline tongue extension  Motor:  5/5 in upper and lower extremities bilaterally including strong and equal grip strength and dorsiflexion/plantar flexion Sensory: Pinprick and light touch normal in all extremities.  Deep Tendon Reflexes: 2+ and symmetric  Cerebellar: normal finger-to-nose with bilateral upper extremities Gait: normal gait and balance CV: distal pulses palpable throughout   Skin: Skin is warm and dry. He is not diaphoretic. No erythema.  Psychiatric: He has a normal mood and  affect.    ED Course  Procedures (including critical care time) Labs Review Labs Reviewed  CBC  COMPREHENSIVE METABOLIC PANEL  TROPONIN I   Imaging Review Dg Chest 2 View  03/19/2014   CLINICAL DATA:  Left side chest pain, shortness of Breath  EXAM: CHEST  2 VIEW  COMPARISON:  None.  FINDINGS: Cardiomediastinal silhouette is unremarkable. No acute infiltrate or pleural effusion. No pulmonary edema. Bony thorax is unremarkable.  IMPRESSION: No active cardiopulmonary disease.   Electronically Signed   By: Natasha MeadLiviu  Pop M.D.   On: 03/19/2014 17:40     EKG Interpretation   Date/Time:  Thursday March 19 2014 17:20:50 EDT Ventricular Rate:  83 PR Interval:  151 QRS Duration: 95 QT Interval:  358 QTC Calculation: 421 R Axis:   46 Text Interpretation:  Sinus rhythm Baseline wander in lead(s) V1 V2 V5 No  previous ECGs available Confirmed by [facility]  MD, Lawrence Hampton ([id number]) on 03/19/2014  5:35:44 PM      MDM   Final diagnoses:  Chest pain  Left arm pain   Pt presents with 1 episode of CP and 2 episodes of left arm painful paresthesias all of which have resolved.  Normal neurologic exam and no respiratory symptoms.  Pt without cardiac hx or risk factors.  PERC negative.  ECG nonischemic and I doubt ACS at this time.  Pt reports feeling worried about these symptoms, but denies heavy lifting, new exercises, or stress at home. Labs and imaging pending.  7:07 PM CBC, CMP, Trop reassuring.  ECG nonischemic.  CXR without evidence of PNA, pneumothorax or pulmonary edema.  PERC negative and low risk for PE.  Symptoms inconsistent with GERD.  Pt denies activities to have caused a muscle strain.  Pt is pain free on my evaluation.    Chest pain is not likely of cardiac or pulmonary etiology d/t presentation, perc negative, VSS, no tracheal deviation, no JVD or new murmur, RRR, breath sounds equal bilaterally, EKG without acute abnormalities, negative troponin, and negative CXR. Pt has been advised to return  to the ED if CP becomes exertional, associated with diaphoresis or nausea, radiates to left jaw/arm, worsens or becomes concerning in any way. Pt appears reliable for follow up and is agreeable to discharge.   It has been determined that no acute conditions requiring further emergency intervention are present at this time. The patient/guardian have been advised of the diagnosis and plan. We have discussed signs and symptoms that warrant return to the ED, such as changes or worsening in symptoms.   Vital signs are stable at discharge.   BP 110/64  Pulse 71  Temp(Src) 97.9 F (36.6 C) (Oral)  Resp 14  SpO2 97%  Patient/guardian has voiced understanding and agreed to follow-up with the PCP or specialist.      Dierdre Forth, PA-C 03/19/14 1921

## 2014-03-19 NOTE — Discharge Instructions (Signed)
1. Medications: ibuprofen for pain control, usual home medications 2. Treatment: rest, drink plenty of fluids,  3. Follow Up: [facility] your chest pain in 3 days.   Chest Pain (Nonspecific) It is often hard to give a specific diagnosis for the cause of chest pain. There is always a chance that your pain could be related to something serious, such as a heart attack or a blood clot in the lungs. You need to follow up with your caregiver for further evaluation. CAUSES   Heartburn.  Pneumonia or bronchitis.  Anxiety or stress.  Inflammation around your heart (pericarditis) or lung (pleuritis or pleurisy).  A blood clot in the lung.  A collapsed lung (pneumothorax). It can develop suddenly on its own (spontaneous pneumothorax) or from injury (trauma) to the chest.  Shingles infection (herpes zoster virus). The chest wall is composed of bones, muscles, and cartilage. Any of these can be the source of the pain.  The bones can be bruised by injury.  The muscles or cartilage can be strained by coughing or overwork.  The cartilage can be affected by inflammation and become sore (costochondritis). DIAGNOSIS  Lab tests or other studies, such as X-rays, electrocardiography, stress testing, or cardiac imaging, may be needed to find the cause of your pain.  TREATMENT   Treatment depends on what may be causing your chest pain. Treatment may include:  Acid blockers for heartburn.  Anti-inflammatory medicine.  Pain medicine for inflammatory conditions.  Antibiotics if an infection is present.  You may be advised to change lifestyle habits. This includes stopping smoking and avoiding alcohol, caffeine, and chocolate.  You may be advised to keep your head raised (elevated) when sleeping. This reduces the chance of acid going backward from your stomach into your esophagus.  Most of the time, nonspecific chest pain will improve  within 2 to 3 days with rest and mild pain medicine. HOME CARE INSTRUCTIONS   If antibiotics were prescribed, take your antibiotics as directed. Finish them even if you start to feel better.  For the next few days, avoid physical activities that bring on chest pain. Continue physical activities as directed.  Do not smoke.  Avoid drinking alcohol.  Only take over-the-counter or prescription medicine for pain, discomfort, or fever as directed by your caregiver.  Follow your caregiver's suggestions for further testing if your chest pain does not go away.  Keep any follow-up appointments you made. If you do not go to an appointment, you could develop lasting (chronic) problems with pain. If there is any problem keeping an appointment, you must call to reschedule. SEEK MEDICAL CARE IF:   You think you are having problems from the medicine you are taking. Read your medicine instructions carefully.  Your chest pain does not go away, even after treatment.  You develop a rash with blisters on your chest. SEEK IMMEDIATE MEDICAL CARE IF:   You have increased chest pain or pain that spreads to your arm, neck, jaw, back, or abdomen.  You develop shortness of breath, an increasing cough, or you are coughing up blood.  You have severe back or abdominal pain, feel nauseous, or vomit.  You develop severe weakness, fainting, or chills.  You have a fever. THIS IS AN EMERGENCY. Do not wait to see if the pain will go away. Get medical help at once. Call your local emergency services (911 in [place] Do not drive yourself to the hospital. MAKE SURE YOU:  Understand these instructions.  Will watch your condition.  Will get help right away if you are not doing well or get worse. Document Released: 09/13/2005 Document Revised: 02/26/2012 Document Reviewed: 07/09/2008 [facility] Patient Information 2014 [place].

## 2015-09-26 ENCOUNTER — Emergency Department (HOSPITAL_COMMUNITY)
Admission: EM | Admit: 2015-09-26 | Discharge: 2015-09-26 | Disposition: A | Discharge status: Home / Self Care | Payer: Self-pay | Attending: Emergency Medicine | Admitting: Emergency Medicine

## 2015-09-26 ENCOUNTER — Encounter (HOSPITAL_COMMUNITY): Payer: Self-pay | Admitting: Emergency Medicine

## 2015-09-26 DIAGNOSIS — Z23 Encounter for immunization: Secondary | ICD-10-CM | POA: Insufficient documentation

## 2015-09-26 DIAGNOSIS — L03116 Cellulitis of left lower limb: Secondary | ICD-10-CM | POA: Insufficient documentation

## 2015-09-26 DIAGNOSIS — Z880 Allergy status to penicillin: Secondary | ICD-10-CM | POA: Insufficient documentation

## 2015-09-26 DIAGNOSIS — L02416 Cutaneous abscess of left lower limb: Secondary | ICD-10-CM | POA: Insufficient documentation

## 2015-09-26 MED ORDER — TETANUS-DIPHTH-ACELL PERTUSSIS 5-2.5-18.5 LF-MCG/0.5 IM SUSP
0.5000 mL | Freq: Once | INTRAMUSCULAR | Status: AC
  Administered 2015-09-26: 0.5 mL via INTRAMUSCULAR
  Filled 2015-09-26: qty 0.5

## 2015-09-26 MED ORDER — IBUPROFEN 800 MG PO TABS: 800.0000 mg | ORAL_TABLET | Freq: Three times a day (TID) | ORAL | Status: DC

## 2015-09-26 MED ORDER — LIDOCAINE-EPINEPHRINE 2 %-1:100000 IJ SOLN
10.0000 mL | Freq: Once | INTRAMUSCULAR | Status: AC
Start: 2015-09-26 — End: 2015-09-26
  Administered 2015-09-26: 10 mL via INTRADERMAL
  Filled 2015-09-26: qty 1

## 2015-09-26 MED ORDER — SULFAMETHOXAZOLE-TRIMETHOPRIM 800-160 MG PO TABS: 1.0000 | ORAL_TABLET | Freq: Two times a day (BID) | ORAL | Status: AC

## 2015-09-26 NOTE — Discharge Instructions (Signed)
Apply warm compress 4 times daily.  Take antibiotic and pain medication.  Return in 48 hrs for wound recheck if no improvement.  Abscess An abscess is an infected area that contains a collection of pus and debris.It can occur in almost any part of the body. An abscess is also known as a furuncle or boil. CAUSES  An abscess occurs when tissue gets infected. This can occur from blockage of oil or sweat glands, infection of hair follicles, or a minor injury to the skin. As the body tries to fight the infection, pus collects in the area and creates pressure under the skin. This pressure causes pain. People with weakened immune systems have difficulty fighting infections and get certain abscesses more often.  SYMPTOMS Usually an abscess develops on the skin and becomes a painful mass that is red, warm, and tender. If the abscess forms under the skin, you may feel a moveable soft area under the skin. Some abscesses break open (rupture) on their own, but most will continue to get worse without care. The infection can spread deeper into the body and eventually into the bloodstream, causing you to feel ill.  DIAGNOSIS  Your caregiver will take your medical history and perform a physical exam. A sample of fluid may also be taken from the abscess to determine what is causing your infection. TREATMENT  Your caregiver may prescribe antibiotic medicines to fight the infection. However, taking antibiotics alone usually does not cure an abscess. Your caregiver may need to make a small cut (incision) in the abscess to drain the pus. In some cases, gauze is packed into the abscess to reduce pain and to continue draining the area. HOME CARE INSTRUCTIONS   Only take over-the-counter or prescription medicines for pain, discomfort, or fever as directed by your caregiver.  If you were prescribed antibiotics, take them as directed. Finish them even if you start to feel better.  If gauze is used, follow your caregiver's  directions for changing the gauze.  To avoid spreading the infection:  Keep your draining abscess covered with a bandage.  Wash your hands well.  Do not share personal care items, towels, or whirlpools with others.  Avoid skin contact with others.  Keep your skin and clothes clean around the abscess.  Keep all follow-up appointments as directed by your caregiver. SEEK MEDICAL CARE IF:   You have increased pain, swelling, redness, fluid drainage, or bleeding.  You have muscle aches, chills, or a general ill feeling.  You have a fever. MAKE SURE YOU:   Understand these instructions.  Will watch your condition.  Will get help right away if you are not doing well or get worse.   This information is not intended to replace advice given to you by your health care provider. Make sure you discuss any questions you have with your health care provider.   Document Released: 09/13/2005 Document Revised: 06/04/2012 Document Reviewed: 02/16/2012 Elsevier Interactive Patient Education [employer]

## 2015-09-26 NOTE — ED Provider Notes (Signed)
CSN: [id number]     Arrival date & time 09/26/15  1138 History   First MD Initiated Contact with Patient 09/26/15 1141     Chief Complaint  Patient presents with  . 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 Patient states 3 days ago he noticed a small bump arise on top of this knee that has since became increasingly large in size with redness and tenderness. Describe sharp throbbing 6 out of 10 pain worsening with movement. He has tried using anabolic ointment, alcohol swabbed clean and without adequate relief. He has also tried to pop complaints of with minimal drainage. He denies any pain within his joint. No fever and no recent injury. He is unable to recall his last tetanus status. He is allergic to penicillin but does not recall the actual reaction. He is a nonsmoker.   History reviewed. No pertinent past medical history. Past Surgical History  Procedure Laterality Date  . Tonsillectomy    . Cystectomy     No family history on file. Social History  Substance Use Topics  . Smoking status: Never Smoker   . Smokeless tobacco: Never Used  . Alcohol Use: No    Review of Systems  Constitutional: Negative for fever.  Skin: Positive for rash.  Neurological: Negative for numbness.      Allergies  Penicillins  Home Medications   Prior to Admission medications   Medication Sig Start Date End Date Taking? Authorizing Provider  diphenhydramine-acetaminophen (TYLENOL PM) 25-500 MG TABS Take 1 tablet by mouth at bedtime as needed (sleep).    Historical Provider, MD   There were no vitals taken for this visit. Physical Exam  Constitutional: He appears well-developed and well-nourished. No distress.  HENT:  Head: Atraumatic.  Eyes: Conjunctivae are normal.  Neck: Neck supple.   Musculoskeletal: He exhibits tenderness (left knee: a posterior blister noted to the skin above the knee with surrounding skin erythema measuring 4 cm in diameter. Mild induration and fluctuance noted. Left knee with full range of motion.).  Neurological: He is alert.  Skin: No rash noted.  Psychiatric: He has a normal mood and affect.  Nursing note and vitals reviewed.   ED Course  Procedures (including critical care time)  11:49 AM Patient has a cutaneous abscess cellulitis to his left lower leg above his left knee. No evidence of septic joint. Will update his tetanus status, and we'll perform incising drainage procedure. Patient will benefit from antibiotic at discharge.  12:07 PM A bedside US performed to confirm presence of abscess.  Image was archived  EMERGENCY DEPARTMENT US SOFT TISSUE INTERPRETATION "Study: Limited Ultrasound of the noted body part in comments below"  INDICATIONS: Soft tissue infection Multiple views of the body part are obtained with a multi-frequency linear probe  PERFORMED BY:  Myself  IMAGES ARCHIVED?: Yes  SIDE:Midline  BODY PART:Other soft tisse (comment in note)  FINDINGS: Abcess present  LIMITATIONS:  Body Habitus  INTERPRETATION:  Abcess present and Cellulitis present  COMMENT:  L anterior knee cutaneous abscess with surrounding cellulitis.    INCISION AND DRAINAGE Performed by: Fayrene Helper Consent: Verbal consent obtained. Risks and benefits: risks, benefits and alternatives were discussed Type: abscess  Body area: L anterior knee  Anesthesia: local infiltration  Incision was made with a scalpel.  Local anesthetic: lidocaine 2% w epinephrine  Anesthetic total:  1 ml  Complexity: complex Blunt dissection to break up loculations  Drainage: purulent  Drainage amount: small  Packing material: none  Patient tolerance: Patient tolerated the procedure well with no immediate complications.    MDM   Final diagnoses:   Abscess of left leg  Cellulitis of leg without foot, left    BP 122/73 mmHg  Pulse 66  Temp(Src) 97.7 F (36.5 C) (Oral)  Resp 17  Ht  (1.778 m)  Wt 240 lb (108.863 kg)  BMI 34.44 kg/m2  SpO2 100%     Fayrene Helper, PA-C 09/26/15 1213  Melene Plan, DO 09/26/15 1307

## 2015-09-26 NOTE — ED Notes (Signed)
Pt states "MRAS bump come up on Friday, i have had them before but went away on their on".  Pt states that he has put antibiotic ointment, alcohol swap to clean it. Pt state that he has tried to pop it himself but hasnt gotten anything out.

## 2015-11-17 ENCOUNTER — Encounter (HOSPITAL_COMMUNITY): Payer: Self-pay

## 2015-11-17 ENCOUNTER — Emergency Department (HOSPITAL_COMMUNITY)
Admission: EM | Admit: 2015-11-17 | Discharge: 2015-11-17 | Disposition: A | Discharge status: Home / Self Care | Payer: Self-pay | Attending: Emergency Medicine | Admitting: Emergency Medicine

## 2015-11-17 DIAGNOSIS — Z880 Allergy status to penicillin: Secondary | ICD-10-CM | POA: Insufficient documentation

## 2015-11-17 DIAGNOSIS — L02416 Cutaneous abscess of left lower limb: Secondary | ICD-10-CM | POA: Insufficient documentation

## 2015-11-17 DIAGNOSIS — Z791 Long term (current) use of non-steroidal anti-inflammatories (NSAID): Secondary | ICD-10-CM | POA: Insufficient documentation

## 2015-11-17 MED ORDER — SULFAMETHOXAZOLE-TRIMETHOPRIM 800-160 MG PO TABS: 1.0000 | ORAL_TABLET | Freq: Two times a day (BID) | ORAL | Status: AC

## 2015-11-17 MED ORDER — TRAMADOL HCL 50 MG PO TABS: 50.0000 mg | ORAL_TABLET | Freq: Four times a day (QID) | ORAL | Status: DC | PRN

## 2015-11-17 MED ORDER — LIDOCAINE HCL (PF) 1 % IJ SOLN
5.0000 mL | Freq: Once | INTRAMUSCULAR | Status: AC
  Administered 2015-11-17: 5 mL
  Filled 2015-11-17: qty 5

## 2015-11-17 NOTE — ED Notes (Signed)
PA at bedside doing I&D 

## 2015-11-17 NOTE — ED Notes (Signed)
Pt c/o increasing abscess on posterior L upper leg x 2 days.  Pain score 3/10 increasing w/ movement.  Pt reports he has applied warm compresses and it "deflated a little bit."

## 2015-11-17 NOTE — ED Provider Notes (Signed)
CSN: [id number]     Arrival date & time 11/17/15  1642 History   By signing my name below, I, Arlan Organshley Leger, attest that this documentation has been prepared under the direction and in the presence of Kambryn Dapolito PA-C.  Electronically Signed: Arlan OrganAshley Leger, ED Scribe. 11/17/2015. 6:38 PM.   Chief Complaint  Patient presents with  . Abscess  . Cellulitis   The history is provided by the patient. No language interpreter was used.    HPI Comments: Lawrence Hampton is a 29 y.o. male who presents to the Emergency Department here for an ongoing, persistent, worsening abscess to the L upper medial thigh x 2 days. Currently pain is rated 3/10. Discomfort is made worse with movement and deep palpation. No alleviating factors at this time. Warm compresses attempted at home with mild temporary improvement for swelling. No recent fever, chills, nausea, vomiting, or noticeable drainage. Pt with known allergy to Penicillins.  PCP: No primary care provider on file.    History reviewed. No pertinent past medical history. Past Surgical History  Procedure Laterality Date  . Tonsillectomy    . Cystectomy     History reviewed. No pertinent family history. Social History  Substance Use Topics  . Smoking status: Never Smoker   . Smokeless tobacco: Never Used  . Alcohol Use: No    Review of Systems  Constitutional: Negative for fever and chills.  Gastrointestinal: Negative for nausea and vomiting.  Skin: Positive for wound.      Allergies  Penicillins  Home Medications   Prior to Admission medications   Medication Sig Start Date End Date Taking? Authorizing Provider  diphenhydramine-acetaminophen (TYLENOL PM) 25-500 MG TABS Take 1 tablet by mouth at bedtime as needed (sleep).    Historical Provider, MD  ibuprofen (ADVIL,MOTRIN) 800 MG tablet Take 1 tablet (800 mg total) by mouth 3 (three) times daily. 09/26/15   Fayrene HelperBowie Tran, PA-C   Triage Vitals: BP 119/68 mmHg  Pulse 78  Temp(Src) 98.1  F (36.7 C) (Temporal)  Resp 18  Wt 240 lb (108.863 kg)  SpO2 98%   Physical Exam  Constitutional: He is oriented to person, place, and time. He appears well-developed and well-nourished.  HENT:  Head: Normocephalic.  Eyes: EOM are normal.  Neck: Normal range of motion.  Pulmonary/Chest: Effort normal.  Abdominal: He exhibits no distension.  Musculoskeletal: Normal range of motion.  Neurological: He is alert and oriented to person, place, and time.  Skin:  Large red fluctuance abscess to medial proximal thigh No active drainage or ulcerations  Induration extends 5 cm in diameter   Psychiatric: He has a normal mood and affect.  Nursing note and vitals reviewed.   ED Course  Procedures (including critical care time)  DIAGNOSTIC STUDIES: Oxygen Saturation is 98% on RA, Normal by my interpretation.    COORDINATION OF CARE: 6:35 PM- Will perform I&D procedure. Discussed treatment plan with pt at bedside and pt agreed to plan.     INCISION AND DRAINAGE PROCEDURE NOTE: Patient identification was confirmed and verbal consent was obtained. This procedure was performed by Elpidio AnisShari Georgia Baria PA-C at 6:36 PM. Site: L upper medial thigh Sterile procedures observed Anesthetic used (type and amt): 1% licodaine without epinephrine Drainage: large, purulent Complexity: Complex Packing used: None Site anesthetized, incision made over site, wound drained and explored loculations, rinsed with copious amounts of normal saline, wound packed with sterile gauze, covered with dry, sterile dressing.  Pt tolerated procedure well without complications.  Instructions for care  discussed verbally and pt provided with additional written instructions for homecare and f/u.   Labs Review Labs Reviewed - No data to display  Imaging Review No results found. I have personally reviewed and evaluated these images and lab results as part of my medical decision-making.   EKG Interpretation None       MDM   Final diagnoses:  None    1. Cutaneous abscess  Successful I&D of large abscess as per above note. Uncomplicated procedure. Will keep on abx 14 days secondary to repeated abscesses and discussed importance of establishing with PCP. Resources provided.  I personally performed the services described in this documentation, which was scribed in my presence. The recorded information has been reviewed and is accurate.     Elpidio Anis, PA-C 11/17/15 1908  Zadie Rhine, MD 11/18/15 1131

## 2015-11-17 NOTE — Discharge Instructions (Signed)
Incision and Drainage Incision and drainage is a procedure in which a sac-like structure (cystic structure) is opened and drained. The area to be drained usually contains material such as pus, fluid, or blood.  LET YOUR CAREGIVER KNOW ABOUT:   Allergies to medicine.  Medicines taken, including vitamins, herbs, eyedrops, over-the-counter medicines, and creams.  Use of steroids (by mouth or creams).  Previous problems with anesthetics or numbing medicines.  History of bleeding problems or blood clots.  Previous surgery.  Other health problems, including diabetes and kidney problems.  Possibility of pregnancy, if this applies. RISKS AND COMPLICATIONS  Pain.  Bleeding.  Scarring.  Infection. BEFORE THE PROCEDURE  You may need to have an ultrasound or other imaging tests to see how large or deep your cystic structure is. Blood tests may also be used to determine if you have an infection or how severe the infection is. You may need to have a tetanus shot. PROCEDURE  The affected area is cleaned with a cleaning fluid. The cyst area will then be numbed with a medicine (local anesthetic). A small incision will be made in the cystic structure. A syringe or catheter may be used to drain the contents of the cystic structure, or the contents may be squeezed out. The area will then be flushed with a cleansing solution. After cleansing the area, it is often gently packed with a gauze or another wound dressing. Once it is packed, it will be covered with gauze and tape or some other type of wound dressing. AFTER THE PROCEDURE   Often, you will be allowed to go home right after the procedure.  You may be given antibiotic medicine to prevent or heal an infection.  If the area was packed with gauze or some other wound dressing, you will likely need to come back in 1 to 2 days to get it removed.  The area should heal in about 14 days.   This information is not intended to replace advice given  to you by your health care provider. Make sure you discuss any questions you have with your health care provider.   Document Released: 05/30/2001 Document Revised: 06/04/2012 Document Reviewed: 01/29/2012 Elsevier Interactive Patient Education 2016 Elsevier Inc.  

## 2016-06-30 ENCOUNTER — Emergency Department (HOSPITAL_COMMUNITY): Payer: BLUE CROSS/BLUE SHIELD

## 2016-06-30 ENCOUNTER — Encounter (HOSPITAL_COMMUNITY): Payer: Self-pay

## 2016-06-30 ENCOUNTER — Emergency Department (HOSPITAL_COMMUNITY)
Admission: EM | Admit: 2016-06-30 | Discharge: 2016-06-30 | Disposition: A | Discharge status: Home / Self Care | Payer: BLUE CROSS/BLUE SHIELD | Attending: Emergency Medicine | Admitting: Emergency Medicine

## 2016-06-30 DIAGNOSIS — N50812 Left testicular pain: Secondary | ICD-10-CM | POA: Insufficient documentation

## 2016-06-30 DIAGNOSIS — R609 Edema, unspecified: Secondary | ICD-10-CM

## 2016-06-30 DIAGNOSIS — N50819 Testicular pain, unspecified: Secondary | ICD-10-CM

## 2016-06-30 LAB — URINALYSIS, ROUTINE W REFLEX MICROSCOPIC
GLUCOSE, UA: NEGATIVE mg/dL
Hgb urine dipstick: NEGATIVE
Ketones, ur: NEGATIVE mg/dL
LEUKOCYTES UA: NEGATIVE
Nitrite: NEGATIVE
PROTEIN: NEGATIVE mg/dL
Specific Gravity, Urine: 1.034 — ABNORMAL HIGH (ref 1.005–1.030)
pH: 6 (ref 5.0–8.0)

## 2016-06-30 NOTE — Progress Notes (Signed)
Patient listed as having BCBs insurance without a pcp.  EDCM spoke to patient at bedside.  [facility] with list of pcps who accept BCBS within a ten mile radius of patient's zip code [phone].  Patient thankfulf or resources.  No further EDCm needs at this time.

## 2016-06-30 NOTE — ED Notes (Signed)
US at bedside

## 2016-06-30 NOTE — ED Notes (Signed)
Testicle "bump" x 7 months.  Started on right.  Now bump on left.  No fever.  Some burning with urination.  Pain comes and goes.

## 2016-06-30 NOTE — ED Provider Notes (Signed)
CSN: [id number]     Arrival date & time 06/30/16  1650 History   First MD Initiated Contact with Patient 06/30/16 1742     Chief Complaint  Patient presents with  . Testicle Pain     (Consider location/radiation/quality/duration/timing/severity/associated sxs/prior Treatment) HPI Comments: Patient presents to the ED with a chief complaint of testicle mass and pain.  States that he has had a slowly growing mass on his left testicle and now he feels one on his right.  States that it is mildly tender to palpation.  Reports having associated dysuria, but denies any penile discharge.  He denies any fevers, chills, nausea, vomiting, or diarrhea.  He states that he was waiting for his insurance to kick in before getting evaluated.  The history is provided by the patient. No language interpreter was used.    History reviewed. No pertinent past medical history. Past Surgical History  Procedure Laterality Date  . Tonsillectomy    . Cystectomy     History reviewed. No pertinent family history. Social History  Substance Use Topics  . Smoking status: Never Smoker   . Smokeless tobacco: Never Used  . Alcohol Use: No    Review of Systems  Constitutional: Negative for fever and chills.  Respiratory: Negative for shortness of breath.   Cardiovascular: Negative for chest pain.  Gastrointestinal: Negative for nausea, vomiting, diarrhea and constipation.  Genitourinary: Positive for dysuria and testicular pain.  All other systems reviewed and are negative.     Allergies  Penicillins  Home Medications   Prior to Admission medications   Medication Sig Start Date End Date Taking? Authorizing Provider  ibuprofen (ADVIL,MOTRIN) 200 MG tablet Take 400 mg by mouth every 6 (six) hours as needed for moderate pain.   Yes Historical Provider, MD  diphenhydramine-acetaminophen (TYLENOL PM) 25-500 MG TABS Take 1 tablet by mouth at bedtime as needed (sleep).    Historical Provider, MD  ibuprofen  (ADVIL,MOTRIN) 800 MG tablet Take 1 tablet (800 mg total) by mouth 3 (three) times daily. Patient not taking: Reported on 11/17/2015 09/26/15   Fayrene HelperBowie Tran, PA-C  traMADol (ULTRAM) 50 MG tablet Take 1 tablet (50 mg total) by mouth every 6 (six) hours as needed. Patient not taking: Reported on 06/30/2016 11/17/15   Elpidio AnisShari Upstill, PA-C   BP 128/79 mmHg  Pulse 77  Temp(Src) 97.9 F (36.6 C) (Oral)  Resp 16  Ht 5\' 10"  (1.778 m)  Wt 104.327 kg  BMI 33.00 kg/m2  SpO2 98% Physical Exam  Constitutional: He is oriented to person, place, and time. He appears well-developed and well-nourished.  HENT:  Head: Normocephalic and atraumatic.  Eyes: Conjunctivae and EOM are normal. Pupils are equal, round, and reactive to light. Right eye exhibits no discharge. Left eye exhibits no discharge. No scleral icterus.  Neck: Normal range of motion. Neck supple. No JVD present.  Cardiovascular: Normal rate, regular rhythm and normal heart sounds.  Exam reveals no gallop and no friction rub.   No murmur heard. Pulmonary/Chest: Effort normal and breath sounds normal. No respiratory distress. He has no wheezes. He has no rales. He exhibits no tenderness.  Abdominal: Soft. He exhibits no distension and no mass. There is no tenderness. There is no rebound and no guarding.  Genitourinary:  No palpable masses, no visible lesions, no discharge, no notable abnormality  Musculoskeletal: Normal range of motion. He exhibits no edema or tenderness.  Neurological: He is alert and oriented to person, place, and time.  Skin: Skin is warm  and dry.  Psychiatric: He has a normal mood and affect. His behavior is normal. Judgment and thought content normal.  Nursing note and vitals reviewed.   ED Course  Procedures (including critical care time) Results for orders placed or performed during the hospital encounter of 06/30/16  Urinalysis, Routine w reflex microscopic (not at [facility])  Result Value Ref Range   Color, Urine AMBER  (A) YELLOW   APPearance CLEAR CLEAR   Specific Gravity, Urine 1.034 (H) 1.005 - 1.030   pH 6.0 5.0 - 8.0   Glucose, UA NEGATIVE NEGATIVE mg/dL   Hgb urine dipstick NEGATIVE NEGATIVE   Bilirubin Urine SMALL (A) NEGATIVE   Ketones, ur NEGATIVE NEGATIVE mg/dL   Protein, ur NEGATIVE NEGATIVE mg/dL   Nitrite NEGATIVE NEGATIVE   Leukocytes, UA NEGATIVE NEGATIVE   US Scrotum  06/30/2016  CLINICAL DATA:  30 year old male with bilateral testicular pain and swelling for 7 months. EXAM: SCROTAL ULTRASOUND DOPPLER ULTRASOUND OF THE TESTICLES TECHNIQUE: Complete ultrasound examination of the testicles, epididymis, and other scrotal structures was performed. Color and spectral Doppler ultrasound were also utilized to evaluate blood flow to the testicles. COMPARISON:  None. FINDINGS: Right testicle Measurements: 4.4 x 2.5 x 2.7 cm. No mass or microlithiasis visualized. Left testicle Measurements: 3.8 x 2.2 x 2.7 cm. No mass or microlithiasis visualized. Right epididymis:  Normal in size and appearance. Left epididymis:  Normal in size and appearance. Hydrocele:  None visualized. Varicocele:  None visualized. Pulsed Doppler interrogation of both testes demonstrates normal low resistance arterial and venous waveforms bilaterally. IMPRESSION: Normal scrotal ultrasound. Testicles normal without mass or torsion. Electronically Signed   By: Harmon Pier M.D.   On: 06/30/2016 19:04   [place] Art/ven Flow Abd Pelv Doppler  06/30/2016  CLINICAL DATA:  30 year old male with bilateral testicular pain and swelling for 7 months. EXAM: SCROTAL ULTRASOUND DOPPLER ULTRASOUND OF THE TESTICLES TECHNIQUE: Complete ultrasound examination of the testicles, epididymis, and other scrotal structures was performed. Color and spectral Doppler ultrasound were also utilized to evaluate blood flow to the testicles. COMPARISON:  None. FINDINGS: Right testicle Measurements: 4.4 x 2.5 x 2.7 cm. No mass or microlithiasis visualized. Left testicle  Measurements: 3.8 x 2.2 x 2.7 cm. No mass or microlithiasis visualized. Right epididymis:  Normal in size and appearance. Left epididymis:  Normal in size and appearance. Hydrocele:  None visualized. Varicocele:  None visualized. Pulsed Doppler interrogation of both testes demonstrates normal low resistance arterial and venous waveforms bilaterally. IMPRESSION: Normal scrotal ultrasound. Testicles normal without mass or torsion. Electronically Signed   By: Harmon Pier M.D.   On: 06/30/2016 19:04      MDM   Final diagnoses:  Testicle pain    Patient with reported mass on testicle.  No abnormality on exam.  Will check [place].   [place] negative.  Normal color flow doppler.  UA is unremarkable.  No discharge.  Recommend PCP follow-up.    Roxy Horseman, PA-C 06/30/16 2038  Mancel Bale, MD 07/01/16 [phone]

## 2016-06-30 NOTE — Discharge Instructions (Signed)
Testicular Self-Exam  A self-exam of your testicles is looking at and feeling your testicles for abnormal lumps or swelling. Several things can cause swelling, lumps, or pain in your testicles. Some of these causes are:   Injuries.   Puffiness, redness, and soreness (inflammation).   Infection.   Extra fluids around your testicle.   Twisted testicles.   Testicular cancer.  The testicles are easiest to check after warm baths or showers. They are harder to examine when you are cold.   Follow these steps while you are standing:   Hold your penis away from your body.   Roll one testicle between your thumb and finger. Feel the entire testicle.   Roll the other testicle between your thumb and finger. Feel the entire testicle.  Feel for lumps, swelling, or discomfort. A normal testicle is egg shaped. It feels firm. It is smooth and not tender. The spermatic cord feels like a firm spaghetti-like cord. It is at the back of your testicle. Examine the crease between the front of your leg and your abdomen. Feel for any bumps that are tender. These could be enlarged lymph nodes.      This information is not intended to replace advice given to you by your health care provider. Make sure you discuss any questions you have with your health care provider.     Document Released: 03/02/2009 Document Revised: 09/24/2013 Document Reviewed: 05/26/2013  Elsevier Interactive Patient Education 2016 Elsevier Inc.

## 2017-01-05 ENCOUNTER — Encounter (HOSPITAL_COMMUNITY): Payer: Self-pay | Admitting: *Deleted

## 2017-01-05 ENCOUNTER — Emergency Department (HOSPITAL_COMMUNITY)
Admission: EM | Admit: 2017-01-05 | Discharge: 2017-01-05 | Disposition: A | Discharge status: Home / Self Care | Payer: BLUE CROSS/BLUE SHIELD | Attending: Emergency Medicine | Admitting: Emergency Medicine

## 2017-01-05 DIAGNOSIS — R112 Nausea with vomiting, unspecified: Secondary | ICD-10-CM | POA: Diagnosis not present

## 2017-01-05 DIAGNOSIS — R197 Diarrhea, unspecified: Secondary | ICD-10-CM | POA: Insufficient documentation

## 2017-01-05 MED ORDER — ONDANSETRON 8 MG PO TBDP
8.0000 mg | ORAL_TABLET | Freq: Once | ORAL | Status: AC
  Administered 2017-01-05: 8 mg via ORAL
  Filled 2017-01-05: qty 1

## 2017-01-05 MED ORDER — ONDANSETRON HCL 4 MG PO TABS: 4.0000 mg | ORAL_TABLET | Freq: Four times a day (QID) | ORAL | 0 refills | Status: DC

## 2017-01-05 NOTE — ED Provider Notes (Signed)
[facility]-EMERGENCY DEPT Provider Note   CSN: [id number] Arrival date & time: 01/05/17  1604  By signing my name below, I, Sonum Patel, attest that this documentation has been prepared under the direction and in the presence of Roxy Horsemanobert Katianne Barre, PA-C. Electronically Signed: Sonum Patel, Neurosurgeoncribe. 01/05/17. 4:24 PM.  History   Chief Complaint Chief Complaint  Patient presents with  . Emesis  . Diarrhea   The history is provided by the patient. No language interpreter was used.     HPI Comments: Lawrence Hampton is a 31 y.o. male who presents to the Emergency Department complaining of nausea and vomiting with associated diarrhea that began 2-3 days ago. His last emesis was 2 hours ago and his last BM was this morning. He reports sick contacts with similar symptoms. He denies abdominal pain, fever.   History reviewed. No pertinent past medical history.  There are no active problems to display for this patient.   Past Surgical History:  Procedure Laterality Date  . CYSTECTOMY    . TONSILLECTOMY         Home Medications    Prior to Admission medications   Medication Sig Start Date End Date Taking? Authorizing Provider  diphenhydramine-acetaminophen (TYLENOL PM) 25-500 MG TABS Take 1 tablet by mouth at bedtime as needed (sleep).    Historical Provider, MD  ibuprofen (ADVIL,MOTRIN) 200 MG tablet Take 400 mg by mouth every 6 (six) hours as needed for moderate pain.    Historical Provider, MD  ibuprofen (ADVIL,MOTRIN) 800 MG tablet Take 1 tablet (800 mg total) by mouth 3 (three) times daily. Patient not taking: Reported on 11/17/2015 09/26/15   Fayrene HelperBowie Tran, PA-C  traMADol (ULTRAM) 50 MG tablet Take 1 tablet (50 mg total) by mouth every 6 (six) hours as needed. Patient not taking: Reported on 06/30/2016 11/17/15   Elpidio AnisShari Upstill, PA-C    Family History No family history on file.  Social History Social History  Substance Use Topics  . Smoking status: Never Smoker  . Smokeless tobacco:  Never Used  . Alcohol use No     Allergies   Penicillins   Review of Systems Review of Systems  Constitutional: Negative for fever.  Gastrointestinal: Positive for diarrhea, nausea and vomiting. Negative for abdominal pain.     Physical Exam Updated Vital Signs BP 117/71 (BP Location: Left Arm)   Pulse 77   Temp 97.8 F (36.6 C) (Oral)   Resp 20   Ht 5\' 10"  (1.778 m)   Wt 193 lb (87.5 kg)   SpO2 100%   BMI 27.69 kg/m   Physical Exam  Constitutional: He is oriented to person, place, and time. He appears well-developed and well-nourished.  HENT:  Head: Normocephalic and atraumatic.  Cardiovascular: Normal rate, regular rhythm and normal heart sounds.   Pulmonary/Chest: Effort normal and breath sounds normal. No respiratory distress. He has no wheezes. He has no rales.  Abdominal: Soft. He exhibits no distension. There is no tenderness. There is no rebound and no guarding.  Neurological: He is alert and oriented to person, place, and time.  Skin: Skin is warm and dry.  Psychiatric: He has a normal mood and affect.  Nursing note and vitals reviewed.    ED Treatments / Results  DIAGNOSTIC STUDIES: Oxygen Saturation is 100% on RA, normal by my interpretation.    COORDINATION OF CARE: 4:25 PM Discussed treatment plan with pt at bedside and pt agreed to plan.    Labs (all labs ordered are listed, but only  abnormal results are displayed) Labs Reviewed - No data to display  EKG  EKG Interpretation None       Radiology No results found.  Procedures Procedures (including critical care time)  Medications Ordered in ED Medications - No data to display   Initial Impression / Assessment and Plan / ED Course  I have reviewed the triage vital signs and the nursing notes.  Pertinent labs & imaging results that were available during my care of the patient were reviewed by me and considered in my medical decision making (see chart for details).     Patient  with 2-3 days of NVD.  Reports sick contacts who have had similar symptoms.  VSS.  No focal abdominal tenderness.  Doesn't appear dehydrated.  Will give zofran and DC to home with symptomatic care.  Final Clinical Impressions(s) / ED Diagnoses   Final diagnoses:  Nausea vomiting and diarrhea    New Prescriptions New Prescriptions   ONDANSETRON (ZOFRAN) 4 MG TABLET    Take 1 tablet (4 mg total) by mouth every 6 (six) hours.   I personally performed the services described in this documentation, which was scribed in my presence. The recorded information has been reviewed and is accurate.      Roxy Horseman, PA-C 01/05/17 1635    Pricilla Loveless, MD 01/11/17 2325

## 2017-01-05 NOTE — ED Triage Notes (Signed)
Pt reports 2-3 days of vomiitng and diarrhea. Last time of emesis 2 pm and diarrhea this am.

## 2018-10-24 ENCOUNTER — Other Ambulatory Visit: Payer: Self-pay

## 2018-10-24 ENCOUNTER — Emergency Department (HOSPITAL_COMMUNITY)
Admission: EM | Admit: 2018-10-24 | Discharge: 2018-10-25 | Disposition: A | Discharge status: Home / Self Care | Payer: BLUE CROSS/BLUE SHIELD | Attending: Emergency Medicine | Admitting: Emergency Medicine

## 2018-10-24 ENCOUNTER — Encounter (HOSPITAL_COMMUNITY): Payer: Self-pay | Admitting: Emergency Medicine

## 2018-10-24 DIAGNOSIS — J029 Acute pharyngitis, unspecified: Secondary | ICD-10-CM | POA: Diagnosis present

## 2018-10-24 DIAGNOSIS — R05 Cough: Secondary | ICD-10-CM | POA: Diagnosis not present

## 2018-10-24 DIAGNOSIS — R51 Headache: Secondary | ICD-10-CM | POA: Insufficient documentation

## 2018-10-24 DIAGNOSIS — R110 Nausea: Secondary | ICD-10-CM | POA: Insufficient documentation

## 2018-10-24 DIAGNOSIS — R509 Fever, unspecified: Secondary | ICD-10-CM | POA: Insufficient documentation

## 2018-10-24 LAB — GROUP A STREP BY PCR: GROUP A STREP BY PCR: NOT DETECTED

## 2018-10-24 MED ORDER — ACETAMINOPHEN 325 MG PO TABS
650.0000 mg | ORAL_TABLET | Freq: Once | ORAL | Status: AC | PRN
  Administered 2018-10-24: 650 mg via ORAL
  Filled 2018-10-24: qty 2

## 2018-10-24 NOTE — ED Triage Notes (Signed)
Pt c/o fever, cough, sore throat x 2 days. Denies cough, last tylenol dose 500mg  at 1500 today.

## 2018-10-24 NOTE — ED Provider Notes (Signed)
[facility] EMERGENCY DEPARTMENT Provider Note   CSN: [id number] Arrival date & time: 10/24/18  2236     History   Chief Complaint Chief Complaint  Patient presents with  . Fever  . Sore Throat    HPI Lawrence Hampton is a 32 y.o. male.  The history is provided by the patient and medical records.  Fever   Associated symptoms include sore throat.  Sore Throat      32 year old male with no significant past medical history presenting to the ED with fever and sore throat for the past 4 days.  States he is also had a little bit of a dry cough but that is minor.  States he made an appointment with his primary care doctor for next week but symptoms are getting worse so he decided to come in states he has not had any trouble swallowing but has not really wanted to eat and drink because of the pain.  Does report some nausea and headache as well.  No dizziness, focal numbness, or weakness.  Been taking Tylenol at home for pain and to control fever.  History reviewed. No pertinent past medical history.  There are no active problems to display for this patient.   Past Surgical History:  Procedure Laterality Date  . CYSTECTOMY    . TONSILLECTOMY          Home Medications    Prior to Admission medications   Medication Sig Start Date End Date Taking? Authorizing Provider  diphenhydramine-acetaminophen (TYLENOL PM) 25-500 MG TABS Take 1 tablet by mouth at bedtime as needed (sleep).    [provider]  ibuprofen (ADVIL,MOTRIN) 200 MG tablet Take 400 mg by mouth every 6 (six) hours as needed for moderate pain.    [provider]  ibuprofen (ADVIL,MOTRIN) 800 MG tablet Take 1 tablet (800 mg total) by mouth 3 (three) times daily. Patient not taking: Reported on 11/17/2015 09/26/15   Fayrene Helper, PA-C  ondansetron (ZOFRAN) 4 MG tablet Take 1 tablet (4 mg total) by mouth every 6 (six) hours. 01/05/17   Roxy Horseman, PA-C  traMADol (ULTRAM) 50 MG  tablet Take 1 tablet (50 mg total) by mouth every 6 (six) hours as needed. Patient not taking: Reported on 06/30/2016 11/17/15   Elpidio Anis, PA-C    Family History No family history on file.  Social History Social History   Tobacco Use  . Smoking status: Never Smoker  . Smokeless tobacco: Never Used  Substance Use Topics  . Alcohol use: No  . Drug use: No     Allergies   Penicillins   Review of Systems Review of Systems  Constitutional: Positive for fever.  HENT: Positive for sore throat.   All other systems reviewed and are negative.    Physical Exam Updated Vital Signs BP (!) 150/79 (BP Location: Right Arm)   Pulse (!) 112   Temp (!) 100.8 F (38.2 C) (Oral)   Resp 20   Ht 5\' 10"  (1.778 m)   Wt 88.5 kg   SpO2 96%   BMI 27.98 kg/m   Physical Exam  Constitutional: He is oriented to person, place, and time. He appears well-developed and well-nourished.  HENT:  Head: Normocephalic and atraumatic.  Mouth/Throat: Oropharynx is clear and moist.  Tonsils absent, posterior oropharynx is beefy red without PND; uvula midline without evidence of peritonsillar or retropharyngeal abscess; handling secretions appropriately; no difficulty swallowing or speaking; normal phonation without stridor  Eyes: Pupils are equal, round,  and reactive to light. Conjunctivae and EOM are normal.  Neck: Normal range of motion.  Cardiovascular: Regular rhythm and normal heart sounds. Tachycardia present.  Pulmonary/Chest: Effort normal and breath sounds normal.  Abdominal: Soft. Bowel sounds are normal.  Musculoskeletal: Normal range of motion.  Neurological: He is alert and oriented to person, place, and time.  Skin: Skin is warm and dry.  Psychiatric: He has a normal mood and affect.  Nursing note and vitals reviewed.    ED Treatments / Results  Labs (all labs ordered are listed, but only abnormal results are displayed) Labs Reviewed  GROUP A STREP BY PCR     EKG None  Radiology No results found.  Procedures Procedures (including critical care time)  Medications Ordered in ED Medications  acetaminophen (TYLENOL) tablet 650 mg (650 mg Oral Given 10/24/18 2248)     Initial Impression / Assessment and Plan / ED Course  I have reviewed the triage vital signs and the nursing notes.  Pertinent labs & imaging results that were available during my care of the patient were reviewed by me and considered in my medical decision making (see chart for details).  32 y.o. M here with fever and sore throat x4 days.  Denies sick contacts.  He is febrile here but non-toxic in appearance.  Tonsils are surgically absent but posterior oropharynx is beefy red.  There is no exudates or postnasal drip.  No edema, uvula midline.  Handling secretions well, normal phonation without stridor.  Rapid strep was sent and is negative.  Suspect this is likely viral process.  Discharged home with viscous lidocaine, symptomatic control for fever.  He has close follow-up scheduled with his PCP next week.  He will return here for any new or worsening symptoms.  Final Clinical Impressions(s) / ED Diagnoses   Final diagnoses:  Sore throat    ED Discharge Orders         Ordered    lidocaine (XYLOCAINE) 2 % solution  As needed     10/25/18 0004           Garlon Hatchet, PA-C 10/25/18 0012    Tegeler, Canary Brim, MD 10/25/18 0131

## 2018-10-25 MED ORDER — LIDOCAINE VISCOUS HCL 2 % MT SOLN: 15.0000 mL | OROMUCOSAL | 0 refills | Status: DC | PRN

## 2018-10-25 NOTE — Discharge Instructions (Signed)
Take the prescribed medication as directed.  Can take tylenol or motrin for fever-- just do not exceed 2g (2000mg ) tylenol a day. Follow-up with your primary care doctor next week as scheduled. Return to the ED for new or worsening symptoms.

## 2019-10-29 ENCOUNTER — Other Ambulatory Visit: Payer: Self-pay

## 2019-10-29 DIAGNOSIS — Z20822 Contact with and (suspected) exposure to covid-19: Secondary | ICD-10-CM

## 2019-10-31 LAB — NOVEL CORONAVIRUS, NAA: SARS-CoV-2, NAA: NOT DETECTED

## 2024-06-11 ENCOUNTER — Emergency Department (HOSPITAL_COMMUNITY)

## 2024-06-11 ENCOUNTER — Emergency Department (HOSPITAL_COMMUNITY)
Admission: EM | Admit: 2024-06-11 | Discharge: 2024-06-11 | Disposition: A | Discharge status: Home / Self Care | Attending: Emergency Medicine | Admitting: Emergency Medicine

## 2024-06-11 ENCOUNTER — Other Ambulatory Visit: Payer: Self-pay

## 2024-06-11 DIAGNOSIS — E860 Dehydration: Secondary | ICD-10-CM | POA: Insufficient documentation

## 2024-06-11 DIAGNOSIS — D72829 Elevated white blood cell count, unspecified: Secondary | ICD-10-CM | POA: Insufficient documentation

## 2024-06-11 DIAGNOSIS — R55 Syncope and collapse: Secondary | ICD-10-CM | POA: Insufficient documentation

## 2024-06-11 LAB — CBC WITH DIFFERENTIAL/PLATELET
Abs Immature Granulocytes: 0.02 10*3/uL (ref 0.00–0.07)
Basophils Absolute: 0.1 10*3/uL (ref 0.0–0.1)
Basophils Relative: 1 %
Eosinophils Absolute: 0.4 10*3/uL (ref 0.0–0.5)
Eosinophils Relative: 3 %
HCT: 48.5 % (ref 39.0–52.0)
Hemoglobin: 16.8 g/dL (ref 13.0–17.0)
Immature Granulocytes: 0 %
Lymphocytes Relative: 20 %
Lymphs Abs: 2.3 10*3/uL (ref 0.7–4.0)
MCH: 30.6 pg (ref 26.0–34.0)
MCHC: 34.6 g/dL (ref 30.0–36.0)
MCV: 88.3 fL (ref 80.0–100.0)
Monocytes Absolute: 0.8 10*3/uL (ref 0.1–1.0)
Monocytes Relative: 7 %
Neutro Abs: 7.8 10*3/uL — ABNORMAL HIGH (ref 1.7–7.7)
Neutrophils Relative %: 69 %
Platelets: 268 10*3/uL (ref 150–400)
RBC: 5.49 MIL/uL (ref 4.22–5.81)
RDW: 12.6 % (ref 11.5–15.5)
WBC: 11.3 10*3/uL — ABNORMAL HIGH (ref 4.0–10.5)
nRBC: 0 % (ref 0.0–0.2)

## 2024-06-11 LAB — COMPREHENSIVE METABOLIC PANEL WITH GFR
ALT: 27 U/L (ref 0–44)
AST: 34 U/L (ref 15–41)
Albumin: 4.5 g/dL (ref 3.5–5.0)
Alkaline Phosphatase: 64 U/L (ref 38–126)
Anion gap: 13 (ref 5–15)
BUN: 20 mg/dL (ref 6–20)
CO2: 25 mmol/L (ref 22–32)
Calcium: 10.1 mg/dL (ref 8.9–10.3)
Chloride: 96 mmol/L — ABNORMAL LOW (ref 98–111)
Creatinine, Ser: 1.35 mg/dL — ABNORMAL HIGH (ref 0.61–1.24)
GFR, Estimated: >60 mL/min (ref >60)
Glucose, Bld: 104 mg/dL — ABNORMAL HIGH (ref 70–99)
Potassium: 3.9 mmol/L (ref 3.5–5.1)
Sodium: 134 mmol/L — ABNORMAL LOW (ref 135–145)
Total Bilirubin: 1 mg/dL (ref 0.0–1.2)
Total Protein: 7.7 g/dL (ref 6.5–8.1)

## 2024-06-11 MED ORDER — SODIUM CHLORIDE 0.9 % IV BOLUS
1000.0000 mL | Freq: Once | INTRAVENOUS | Status: AC
  Administered 2024-06-11: 1000 mL via INTRAVENOUS

## 2024-06-11 NOTE — ED Provider Notes (Addendum)
 [place] EMERGENCY DEPARTMENT AT [facility] Provider Note   CSN: [id number] Arrival date & time: 06/11/24  9940     Patient presents with: Syncope   Lawrence Hampton is a 38 y.o. male presents to the ED out of concern of syncope.  States he works at [employer].  Reports that earlier this evening he was at work and has no state of health.  Reports that he had not eaten much today or drinking much water.  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  Reports that he is gone from a sitting to a standing position and was working when he became very lightheaded and all of a sudden the next and he remembers is waking up on the floor with his coworkers surrounding him.  He reports that he passed out.  He denies any preceding chest pain or shortness of breath.  Denies any tongue biting, urinary incontinence.  Denies history of seizures.  Reports that when he passed out he fell backwards hitting his head.  He denies blood thinners.  He reports that he was unconscious for maybe 1 minute and then regained consciousness.  He reports he has a mild headache.  He states he has mild pain to his left knee and right elbow.  He denies any blurred vision, nausea, vomiting.  He denies any current chest pain or shortness of breath.  He reports that he took Tylenol  after suffering head injury and this has relieved his head pain.   HPI     Prior to Admission medications   Medication Sig Start Date End Date Taking? Authorizing Provider  diphenhydramine-acetaminophen  (TYLENOL  PM) 25-500 MG TABS Take 1 tablet by mouth at bedtime as needed (sleep).    [provider]  ibuprofen  (ADVIL ,MOTRIN ) 200 MG tablet Take 400 mg by mouth every 6 (six) hours as needed for moderate pain.    [provider]  ibuprofen  (ADVIL ,MOTRIN ) 800 MG tablet Take 1 tablet (800 mg total) by mouth 3 (three) times daily. Patient not taking: Reported on 11/17/2015 09/26/15   Nivia Colon,  PA-C  lidocaine  (XYLOCAINE ) 2 % solution Use as directed 15 mLs in the mouth or throat as needed (throat pain). 10/25/18   Jarold Olam HERO, PA-C  ondansetron  (ZOFRAN ) 4 MG tablet Take 1 tablet (4 mg total) by mouth every 6 (six) hours. 01/05/17   Vicky Charleston, PA-C  traMADol  (ULTRAM ) 50 MG tablet Take 1 tablet (50 mg total) by mouth every 6 (six) hours as needed. Patient not taking: Reported on 06/30/2016 11/17/15   Odell Balls, PA-C    Allergies: Penicillins    Review of Systems  Neurological:  Positive for syncope.  All other systems reviewed and are negative.   Updated Vital Signs BP (!) 102/58 (BP Location: Right Arm)   Pulse 74   Temp 98.2 F (36.8 C) (Oral)   Resp 18   SpO2 99%   Physical Exam Vitals and nursing note reviewed.  Constitutional:      General: He is not in acute distress.    Appearance: He is well-developed.  HENT:     Head: Normocephalic and atraumatic.     Comments: No trauma to patient's scalp  Eyes:     Conjunctiva/sclera: Conjunctivae normal.    Cardiovascular:     Rate and Rhythm: Normal rate and regular rhythm.     Heart sounds: No murmur heard. Pulmonary:     Effort: Pulmonary effort is normal. No  respiratory distress.     Breath sounds: Normal breath sounds.  Abdominal:     Palpations: Abdomen is soft.     Tenderness: There is no abdominal tenderness.   Musculoskeletal:        General: No swelling.     Cervical back: Neck supple.   Skin:    General: Skin is warm and dry.     Capillary Refill: Capillary refill takes less than 2 seconds.   Neurological:     General: No focal deficit present.     Mental Status: He is alert and oriented to person, place, and time. Mental status is at baseline.     GCS: GCS eye subscore is 4. GCS verbal subscore is 5. GCS motor subscore is 6.     Cranial Nerves: Cranial nerves 2-12 are intact. No cranial nerve deficit.     Sensory: Sensation is intact. No sensory deficit.     Motor: Motor function  is intact.     Coordination: Coordination is intact. Heel to [facility] Test normal.     Comments: CN III through XII intact.  Intact finger-nose, heel-to-shin.  No pronator drift, no slurred speech or facial droop.  Equal strength throughout.  Equal sensation throughout.  PERRL.  Tracks across midline.  Alert and oriented x 4.  Psychiatric:        Mood and Affect: Mood normal.     (all labs ordered are listed, but only abnormal results are displayed) Labs Reviewed  CBC WITH DIFFERENTIAL/PLATELET - Abnormal; Notable for the following components:      Result Value   WBC 11.3 (*)    Neutro Abs 7.8 (*)    All other components within normal limits  COMPREHENSIVE METABOLIC PANEL WITH GFR - Abnormal; Notable for the following components:   Sodium 134 (*)    Chloride 96 (*)    Glucose, Bld 104 (*)    Creatinine, Ser 1.35 (*)    All other components within normal limits    EKG: EKG Interpretation Date/Time:  Wednesday June 11 2024 04:53:17 EDT Ventricular Rate:  73 PR Interval:  154 QRS Duration:  102 QT Interval:  376 QTC Calculation: 414 R Axis:   33  Text Interpretation: Normal sinus rhythm Normal ECG When compared with ECG of 19-Mar-2014 17:20, No significant change was found Confirmed by Raford Lenis ([id number]) on 06/11/2024 4:54:48 AM  Radiology: CT Head Wo Contrast Result Date: 06/11/2024 EXAM: CT HEAD WITHOUT 06/11/2024 05:22:33 AM TECHNIQUE: CT of the head was performed without the administration of intravenous contrast. Automated exposure control, iterative reconstruction, and/or weight based adjustment of the mA/kV was utilized to reduce the radiation dose to as low as reasonably achievable. COMPARISON: None available. CLINICAL HISTORY: Head trauma, moderate-severe. Patient reports brief syncopal episode at work this evening, hit his head against a tire, no LOC, alert and oriented, ambulatory, mild headache relieved by Tylenol , states mild right elbow and left knee pain. FINDINGS: BRAIN  AND VENTRICLES: No acute intracranial hemorrhage. No mass effect or midline shift. No extra-axial fluid collection. Gray-white differentiation is maintained. No hydrocephalus. 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 It measures up to 7 mm in width. No midline abnormalities are present. ORBITS: No acute abnormality. SINUSES AND MASTOIDS: Mild mucosal thickening is present within anterior ethmoid air cells and the inferior left maxillary sinus. No fluid levels are present. SOFT TISSUES AND SKULL: No acute skull fracture. No acute soft tissue abnormality. IMPRESSION: 1. No acute intracranial abnormality related to the  reported head trauma. 2. 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 3. Mild mucosal thickening within anterior ethmoid air cells and the inferior left maxillary sinus, without fluid levels. Electronically signed by: Lonni Necessary MD 06/11/2024 05:45 AM EDT RP Workstation: [id number]     Procedures   Medications Ordered in the ED  sodium chloride 0.9 % bolus 1,000 mL (1,000 mLs Intravenous New Bag/Given 06/11/24 0507)    Medical Decision Making Amount and/or Complexity of Data Reviewed Labs: ordered. Radiology: ordered.   38 year old male presents for evaluation of syncope.  Please see HPI for further details.  On examination, the patient neurological examination is at baseline without focal neurodeficits.  He is overall nontoxic in appearance.  His blood pressure is slightly soft at 102/58.  He had labs ordered in triage to include a CBC and a CMP.  The patient CBC shows a leukocytosis to 11.3.  His metabolic panel reveals a sodium 134, creatinine 1.35.  No baseline creatinine to compare this to.  Patient reports that he fell and struck his head during his syncopal event.  CT scan of head was collected which was unremarkable.  EKG also collected which is nonischemic.  Suspect main cause of patient's symptoms secondary to  dehydration, overheated environment.  Patient most likely had vasovagal syncope.  Patient will be given 1 L of fluid and reassessed.  At the end of my shift, the patient had not received his fluid.  He was signed out to oncoming provider Lawrence Hampton.  Plan of management discussed.    Final diagnoses:  Syncope and collapse  Dehydration    ED Discharge Orders     None            Ruthell Lonni JULIANNA DEVONNA 06/11/24 0615    Raford Lenis, MD 06/11/24 [phone]

## 2024-06-11 NOTE — ED Provider Notes (Signed)
 Received signout from previous provider, please see his note for complete H&P.  This is a generally healthy 38 year old male who had a syncopal episode while at work today.  He did strike his head but no significant injury.  Workup overall reassuring no concerning cardiac arrhythmia no signs of injury.  Patient was given IV fluid and felt much better.  Suspect that his syncopal episode is likely due to walking in a hot environment and this is likely dehydration or possibly vasovagal episode.  At this time patient felt comfortable going home.  He does not have any cardiac history and history.  He has no other complaint.  I gave patient return precaution.  He ambulates without difficulty.  BP 103/66 (BP Location: Right Arm)   Pulse 71   Temp 97.7 F (36.5 C) (Oral)   Resp 16   SpO2 100%   Results for orders placed or performed during the hospital encounter of 06/11/24  CBC with Differential   Collection Time: 06/11/24  1:30 AM  Result Value Ref Range   WBC 11.3 (H) 4.0 - 10.5 K/uL   RBC 5.49 4.22 - 5.81 MIL/uL   Hemoglobin 16.8 13.0 - 17.0 g/dL   HCT 51.4 60.9 - 47.9 %   MCV 88.3 80.0 - 100.0 fL   MCH 30.6 26.0 - 34.0 pg   MCHC 34.6 30.0 - 36.0 g/dL   RDW 87.3 88.4 - 84.4 %   Platelets 268 150 - 400 K/uL   nRBC 0.0 0.0 - 0.2 %   Neutrophils Relative % 69 %   Neutro Abs 7.8 (H) 1.7 - 7.7 K/uL   Lymphocytes Relative 20 %   Lymphs Abs 2.3 0.7 - 4.0 K/uL   Monocytes Relative 7 %   Monocytes Absolute 0.8 0.1 - 1.0 K/uL   Eosinophils Relative 3 %   Eosinophils Absolute 0.4 0.0 - 0.5 K/uL   Basophils Relative 1 %   Basophils Absolute 0.1 0.0 - 0.1 K/uL   Immature Granulocytes 0 %   Abs Immature Granulocytes 0.02 0.00 - 0.07 K/uL  Comprehensive metabolic panel   Collection Time: 06/11/24  1:30 AM  Result Value Ref Range   Sodium 134 (L) 135 - 145 mmol/L   Potassium 3.9 3.5 - 5.1 mmol/L   Chloride 96 (L) 98 - 111 mmol/L   CO2 25 22 - 32 mmol/L   Glucose, Bld 104 (H) 70 - 99 mg/dL    BUN 20 6 - 20 mg/dL   Creatinine, Ser 8.64 (H) 0.61 - 1.24 mg/dL   Calcium 89.8 8.9 - 89.6 mg/dL   Total Protein 7.7 6.5 - 8.1 g/dL   Albumin 4.5 3.5 - 5.0 g/dL   AST 34 15 - 41 U/L   ALT 27 0 - 44 U/L   Alkaline Phosphatase 64 38 - 126 U/L   Total Bilirubin 1.0 0.0 - 1.2 mg/dL   GFR, Estimated >39 >39 mL/min   Anion gap 13 5 - 15   CT Head Wo Contrast Result Date: 06/11/2024 EXAM: CT HEAD WITHOUT 06/11/2024 05:22:33 AM TECHNIQUE: CT of the head was performed without the administration of intravenous contrast. Automated exposure control, iterative reconstruction, and/or weight based adjustment of the mA/kV was utilized to reduce the radiation dose to as low as reasonably achievable. COMPARISON: None available. CLINICAL HISTORY: Head trauma, moderate-severe. Patient reports brief syncopal episode at work this evening, hit his head against a tire, no LOC, alert and oriented, ambulatory, mild headache relieved by Tylenol , states mild right elbow and  left knee pain. FINDINGS: BRAIN AND VENTRICLES: No acute intracranial hemorrhage. No mass effect or midline shift. No extra-axial fluid collection. Gray-white differentiation is maintained. No hydrocephalus. 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 It measures up to 7 mm in width. No midline abnormalities are present. ORBITS: No acute abnormality. SINUSES AND MASTOIDS: Mild mucosal thickening is present within anterior ethmoid air cells and the inferior left maxillary sinus. No fluid levels are present. SOFT TISSUES AND SKULL: No acute skull fracture. No acute soft tissue abnormality. IMPRESSION: 1. No acute intracranial abnormality related to the reported head trauma. 2. 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 3. Mild mucosal thickening within anterior ethmoid air cells and the inferior left maxillary sinus, without fluid levels. Electronically signed by: Lonni Necessary MD 06/11/2024 05:45 AM EDT RP  Workstation: [id number]       Nivia Colon, PA-C 06/11/24 9281    Ula Prentice SAUNDERS, MD 06/11/24 [phone]

## 2024-06-11 NOTE — ED Triage Notes (Signed)
 Patient reports brief syncopal episode at work this evening , hit his head against a tire , no LOC /alert and oriented , ambulatory , mild headache relieved by Tylenol  , states mild right elbow and left knee pain .

## 2024-06-11 NOTE — Discharge Instructions (Signed)
 It was a pleasure taking part in your care.  As discussed, I believe that your episode of passing out was due to the fact that you are dehydrated.  Please increase the amount of fluid you are taking and especially prior to working in hot environments.  Please follow-up with your PCP for recheck of your creatinine.  Return to the ED with any new or worsening symptoms.

## 2025-01-23 ENCOUNTER — Ambulatory Visit (INDEPENDENT_AMBULATORY_CARE_PROVIDER_SITE_OTHER)

## 2025-01-23 ENCOUNTER — Ambulatory Visit
Admission: EM | Admit: 2025-01-23 | Discharge: 2025-01-23 | Disposition: A | Discharge status: Home / Self Care | Source: Home / Self Care

## 2025-01-23 ENCOUNTER — Encounter: Payer: Self-pay | Admitting: Emergency Medicine

## 2025-01-23 DIAGNOSIS — R059 Cough, unspecified: Secondary | ICD-10-CM

## 2025-01-23 DIAGNOSIS — J069 Acute upper respiratory infection, unspecified: Secondary | ICD-10-CM

## 2025-01-23 HISTORY — DX: Other specified health status: Z78.9

## 2025-01-23 MED ORDER — BENZONATATE 100 MG PO CAPS: 100.0000 mg | ORAL_CAPSULE | Freq: Three times a day (TID) | ORAL | 0 refills | Status: AC

## 2025-01-23 MED ORDER — PREDNISONE 50 MG PO TABS: ORAL_TABLET | ORAL | 0 refills | Status: AC

## 2025-01-23 MED ORDER — GUAIFENESIN ER 600 MG PO TB12: 600.0000 mg | ORAL_TABLET | Freq: Two times a day (BID) | ORAL | 0 refills | Status: AC

## 2025-01-23 NOTE — ED Provider Notes (Signed)
 " [facility]    CSN: [id number] Arrival date & time: 01/23/25  1152      History   Chief Complaint Chief Complaint  Patient presents with   Cough   Headache   Shortness of Breath   Fatigue    HPI Lawrence Hampton is a 39 y.o. male.   Pt states that she has been experiencing 6 days of dry cough, nasal congestion, headache, fatigue, and reduced appetite. Pt states that he has been taking Dayquil and Nyquil and Naprosyn for symptoms with no significant relief of symptoms.   The history is provided by the patient.  Cough Associated symptoms: headaches and shortness of breath   Headache Associated symptoms: cough   Shortness of Breath Associated symptoms: cough and headaches     Past Medical History:  Diagnosis Date   No pertinent past medical history     There are no active problems to display for this patient.   Past Surgical History:  Procedure Laterality Date   CYSTECTOMY     TONSILLECTOMY         Home Medications    Prior to Admission medications  Medication Sig Start Date End Date Taking? Authorizing Provider  benzonatate  (TESSALON ) 100 MG capsule Take 1 capsule (100 mg total) by mouth every 8 (eight) hours. 01/23/25  Yes Andra Corean BROCKS, PA-C  diphenhydramine-acetaminophen  (TYLENOL  PM) 25-500 MG TABS Take 1 tablet by mouth at bedtime as needed (sleep).   Yes [provider]  guaiFENesin  (MUCINEX ) 600 MG 12 hr tablet Take 1 tablet (600 mg total) by mouth 2 (two) times daily for 10 days. 01/23/25 02/02/25 Yes Andra Corean BROCKS, PA-C  predniSONE  (DELTASONE ) 50 MG tablet Take 1 tab po daily for 5 days 01/23/25  Yes Andra Corean BROCKS, PA-C    Family History History reviewed. No pertinent family history.  Social History Social History[1]   Allergies   Penicillins   Review of Systems Review of Systems  Respiratory:  Positive for cough and shortness of breath.   Neurological:  Positive for headaches.     Physical  Exam Triage Vital Signs ED Triage Vitals [01/23/25 1258]  Encounter Vitals Group     BP 119/83     Girls Systolic BP Percentile      Girls Diastolic BP Percentile      Boys Systolic BP Percentile      Boys Diastolic BP Percentile      Pulse Rate 72     Resp 16     Temp 97.9 F (36.6 C)     Temp Source Oral     SpO2 98 %     Weight      Height      Head Circumference      Peak Flow      Pain Score 0     Pain Loc      Pain Education      Exclude from Growth Chart    No data found.  Updated Vital Signs BP 119/83 (BP Location: Left Arm)   Pulse 72   Temp 97.9 F (36.6 C) (Oral)   Resp 16   SpO2 98%   Visual Acuity Right Eye Distance:   Left Eye Distance:   Bilateral Distance:    Right Eye Near:   Left Eye Near:    Bilateral Near:     Physical Exam Vitals and nursing note reviewed.  Constitutional:      General: He is not in acute distress.  Appearance: Normal appearance. He is not ill-appearing, toxic-appearing or diaphoretic.  Eyes:     General: No scleral icterus. Cardiovascular:     Rate and Rhythm: Normal rate and regular rhythm.     Heart sounds: Normal heart sounds.  Pulmonary:     Effort: Pulmonary effort is normal. No respiratory distress.     Breath sounds: Normal breath sounds. No wheezing or rhonchi.  Skin:    General: Skin is warm.  Neurological:     Mental Status: He is alert and oriented to person, place, and time.  Psychiatric:        Mood and Affect: Mood normal.        Behavior: Behavior normal.      UC Treatments / Results  Labs (all labs ordered are listed, but only abnormal results are displayed) Labs Reviewed - No data to display  EKG   Radiology No results found.  Procedures Procedures (including critical care time)  Medications Ordered in UC Medications - No data to display  Initial Impression / Assessment and Plan / UC Course  I have reviewed the triage vital signs and the nursing notes.  Pertinent labs &  imaging results that were available during my care of the patient were reviewed by me and considered in my medical decision making (see chart for details).      Final Clinical Impressions(s) / UC Diagnoses   Final diagnoses:  Cough, unspecified type  Viral URI     Discharge Instructions      Chest xray shows no signs of pneumonia. Will send mucinex , tessalon , and short course of steroids for symptoms.      ED Prescriptions     Medication Sig Dispense Auth. Provider   guaiFENesin  (MUCINEX ) 600 MG 12 hr tablet Take 1 tablet (600 mg total) by mouth 2 (two) times daily for 10 days. 20 tablet Andra Krabbe C, PA-C   benzonatate  (TESSALON ) 100 MG capsule Take 1 capsule (100 mg total) by mouth every 8 (eight) hours. 30 capsule Anvi Mangal C, PA-C   predniSONE  (DELTASONE ) 50 MG tablet Take 1 tab po daily for 5 days 5 tablet Andra Krabbe BROCKS, PA-C      PDMP not reviewed this encounter.    [1]  Social History Tobacco Use   Smoking status: Never   Smokeless tobacco: Never  Vaping Use   Vaping status: Never Used  Substance Use Topics   Alcohol use: No   Drug use: No     Andra Krabbe BROCKS, PA-C 01/23/25 1356  "

## 2025-01-23 NOTE — ED Triage Notes (Signed)
 Pt reports productive cough, headaches, fatigue, and intermittent periods of SOB & dizziness episodes x6 days. Taking dayquil, nyquil, and naproxen with little relief. Appetite is decreased, but pt is maintaining fluid intake. Denies fevers and chills. Sick family and coworkers.

## 2025-01-23 NOTE — Discharge Instructions (Addendum)
 Chest xray shows no signs of pneumonia. Will send mucinex , tessalon , and short course of steroids for symptoms.   If symptoms do not improve please follow-up with PCP.

## 2025-01-23 NOTE — ED Triage Notes (Signed)
 Rooms full in back. Vitals taken in lobby. Unlabored breathing. SpO2: 96%, HR: 77, Temp: 97.6 temporal. A&O x4
